# Patient Record
Sex: Female | Born: 1960 | Race: White | Hispanic: No | Smoking: Former smoker
Health system: Southern US, Community
[De-identification: ages and names within clinical notes are randomized; demographics above are authoritative.]

## PROBLEM LIST (undated history)

## (undated) DIAGNOSIS — N950 Postmenopausal bleeding: Secondary | ICD-10-CM

## (undated) DIAGNOSIS — K279 Peptic ulcer, site unspecified, unspecified as acute or chronic, without hemorrhage or perforation: Secondary | ICD-10-CM

## (undated) DIAGNOSIS — M3310 Other dermatopolymyositis, organ involvement unspecified: Secondary | ICD-10-CM

## (undated) DIAGNOSIS — F329 Major depressive disorder, single episode, unspecified: Secondary | ICD-10-CM

## (undated) DIAGNOSIS — N3010 Interstitial cystitis (chronic) without hematuria: Secondary | ICD-10-CM

## (undated) DIAGNOSIS — F32A Depression, unspecified: Secondary | ICD-10-CM

## (undated) DIAGNOSIS — R809 Proteinuria, unspecified: Secondary | ICD-10-CM

## (undated) DIAGNOSIS — K219 Gastro-esophageal reflux disease without esophagitis: Secondary | ICD-10-CM

## (undated) DIAGNOSIS — M1990 Unspecified osteoarthritis, unspecified site: Secondary | ICD-10-CM

## (undated) DIAGNOSIS — E785 Hyperlipidemia, unspecified: Secondary | ICD-10-CM

## (undated) HISTORY — PX: TUBAL LIGATION: SHX77

## (undated) HISTORY — PX: CHOLECYSTECTOMY: SHX55

## (undated) HISTORY — PX: ABDOMINAL HYSTERECTOMY: SHX81

---

## 1978-08-30 DIAGNOSIS — R809 Proteinuria, unspecified: Secondary | ICD-10-CM

## 1978-08-30 HISTORY — DX: Proteinuria, unspecified: R80.9

## 2004-08-30 DIAGNOSIS — N3010 Interstitial cystitis (chronic) without hematuria: Secondary | ICD-10-CM

## 2004-08-30 HISTORY — DX: Interstitial cystitis (chronic) without hematuria: N30.10

## 2005-02-04 ENCOUNTER — Ambulatory Visit: Payer: Self-pay | Admitting: Obstetrics and Gynecology

## 2005-03-24 ENCOUNTER — Ambulatory Visit: Payer: Self-pay | Admitting: Urology

## 2005-08-04 ENCOUNTER — Ambulatory Visit: Payer: Self-pay | Admitting: Rheumatology

## 2010-01-10 ENCOUNTER — Ambulatory Visit: Payer: Self-pay | Admitting: Internal Medicine

## 2010-06-02 ENCOUNTER — Ambulatory Visit: Payer: Self-pay | Admitting: Obstetrics and Gynecology

## 2010-07-14 ENCOUNTER — Other Ambulatory Visit: Payer: Self-pay

## 2010-08-11 ENCOUNTER — Ambulatory Visit: Payer: Self-pay | Admitting: Unknown Physician Specialty

## 2010-08-30 DIAGNOSIS — M1990 Unspecified osteoarthritis, unspecified site: Secondary | ICD-10-CM

## 2010-08-30 DIAGNOSIS — M3310 Other dermatopolymyositis, organ involvement unspecified: Secondary | ICD-10-CM

## 2010-08-30 DIAGNOSIS — K279 Peptic ulcer, site unspecified, unspecified as acute or chronic, without hemorrhage or perforation: Secondary | ICD-10-CM

## 2010-08-30 HISTORY — DX: Peptic ulcer, site unspecified, unspecified as acute or chronic, without hemorrhage or perforation: K27.9

## 2010-08-30 HISTORY — DX: Other dermatomyositis, organ involvement unspecified: M33.10

## 2010-08-30 HISTORY — DX: Unspecified osteoarthritis, unspecified site: M19.90

## 2010-10-28 ENCOUNTER — Ambulatory Visit: Payer: Self-pay | Admitting: Unknown Physician Specialty

## 2010-11-29 ENCOUNTER — Ambulatory Visit: Payer: Self-pay | Admitting: Rheumatology

## 2011-06-10 ENCOUNTER — Ambulatory Visit: Payer: Self-pay | Admitting: Obstetrics and Gynecology

## 2011-09-03 ENCOUNTER — Ambulatory Visit: Payer: Self-pay

## 2011-09-03 ENCOUNTER — Emergency Department: Payer: Self-pay | Admitting: Unknown Physician Specialty

## 2011-09-03 LAB — COMPREHENSIVE METABOLIC PANEL
Albumin: 4 g/dL (ref 3.4–5.0)
Alkaline Phosphatase: 137 U/L — ABNORMAL HIGH (ref 50–136)
BUN: 11 mg/dL (ref 7–18)
Calcium, Total: 8.8 mg/dL (ref 8.5–10.1)
Chloride: 106 mmol/L (ref 98–107)
Creatinine: 0.76 mg/dL (ref 0.60–1.30)
Glucose: 93 mg/dL (ref 65–99)
SGOT(AST): 38 U/L — ABNORMAL HIGH (ref 15–37)
Sodium: 142 mmol/L (ref 136–145)
Total Protein: 7.3 g/dL (ref 6.4–8.2)

## 2011-09-03 LAB — LIPASE, BLOOD: Lipase: 158 U/L (ref 73–393)

## 2011-09-03 LAB — CBC
HCT: 41.5 % (ref 35.0–47.0)
MCV: 94 fL (ref 80–100)
RBC: 4.43 10*6/uL (ref 3.80–5.20)

## 2011-09-03 LAB — MAGNESIUM: Magnesium: 1.6 mg/dL — ABNORMAL LOW

## 2011-09-03 LAB — URINALYSIS, COMPLETE
Bacteria: NONE SEEN
Bilirubin,UR: NEGATIVE
Blood: NEGATIVE
Glucose,UR: NEGATIVE mg/dL (ref 0–75)
RBC,UR: <1 /HPF (ref 0–5)
Specific Gravity: 1.01 (ref 1.003–1.030)
Squamous Epithelial: <1
WBC UR: 2 /HPF (ref 0–5)

## 2012-06-14 ENCOUNTER — Ambulatory Visit: Payer: Self-pay | Admitting: Obstetrics and Gynecology

## 2012-08-30 DIAGNOSIS — N950 Postmenopausal bleeding: Secondary | ICD-10-CM

## 2012-08-30 HISTORY — DX: Postmenopausal bleeding: N95.0

## 2012-12-01 ENCOUNTER — Ambulatory Visit: Payer: Self-pay | Admitting: Unknown Physician Specialty

## 2012-12-06 ENCOUNTER — Ambulatory Visit: Payer: Self-pay | Admitting: Surgery

## 2012-12-07 LAB — PATHOLOGY REPORT

## 2013-06-15 ENCOUNTER — Ambulatory Visit: Payer: Self-pay | Admitting: Obstetrics and Gynecology

## 2013-07-12 ENCOUNTER — Ambulatory Visit: Payer: Self-pay | Admitting: Unknown Physician Specialty

## 2014-06-18 ENCOUNTER — Ambulatory Visit: Payer: Self-pay | Admitting: Obstetrics and Gynecology

## 2014-12-20 NOTE — Op Note (Signed)
PATIENT NAME:  Amy Hammond, Amy Hammond MR#:  [id number] DATE OF BIRTH:  04/10/1961  DATE OF PROCEDURE:  12/06/2012  PREOPERATIVE DIAGNOSIS: Chronic cholecystitis and cholelithiasis.   POSTOPERATIVE DIAGNOSIS: Chronic cholecystitis and cholelithiasis.   PROCEDURE: Laparoscopic cholecystectomy and cholangiogram.   SURGEON: Loreli Dollar, MD.   ANESTHESIA: General.   INDICATIONS: This 54 year old female has had multiple bouts of epigastric pains. She had ultrasound findings of gallstones, and surgery was recommended for definitive treatment.   DESCRIPTION OF PROCEDURE: The patient was placed on the operating table in the supine position under general anesthesia. The abdomen was prepared with ChloraPrep and draped in a sterile manner. A short incision was made in the inferior aspect of the umbilicus and carried down to the deep fascia which was grasped with a laryngeal hook and elevated. A Veress needle was inserted, aspirated and irrigated with a saline solution. Next, the peritoneal cavity was inflated with carbon dioxide. The Veress needle was removed. The 10 mm cannula was inserted. The 10 mm, 0-degree laparoscope was inserted to view the peritoneal cavity. The liver appeared normal. The patient was placed in reverse Trendelenburg position. Another incision was made in the epigastrium slightly to the right of the midline to introduce an 11 mm cannula. Two incisions were made in the lateral aspect of the right lower quadrant to introduce two 5-mm cannulas.   The gallbladder was retracted towards the right shoulder. A number of adhesions were taken down with blunt and sharp dissection. The porta hepatis was demonstrated. The cystic duct was dissected free from surrounding structures. The cystic artery was dissected free from surrounding structures. The neck of the gallbladder was mobilized with incision of the visceral peritoneum. A critical view of safety was demonstrated. An Endo Clip was placed across  the cystic duct adjacent to the neck of the gallbladder. An incision was made in the cystic duct to introduce a Reddick catheter. Half-strength Conray-60 dye was injected as the cholangiogram was done with fluoroscopy viewing the biliary tree and flow of dye into the duodenum. No retained stones were seen. The Reddick catheter was removed. The cystic duct was doubly ligated with Endo Clips and divided. The cystic artery was controlled with double Endo Clips and divided. The gallbladder was dissected free from the liver with hook and cautery. Bleeding was very scant. The gallbladder was delivered up through the infraumbilical incision, opened and suctioned, removed and sent with multiple small palpable stones for routine pathology. The right upper quadrant was further inspected. Hemostasis was intact. The cannulas were removed, allowing carbon dioxide to escape from the peritoneal cavity. Skin incisions were closed with interrupted 5-0 chromic subcuticular sutures, benzoin and Steri-Strips. Dressings were applied with paper tape. The patient tolerated surgery satisfactorily and was prepared for transfer to the recovery room.    ____________________________ Lenna Sciara. Rochel Brome, MD jws:gb D: 12/13/2012 21:27:42 ET T: 12/13/2012 21:56:40 ET JOB#: [id number]  cc: Loreli Dollar, MD, <Dictator> Loreli Dollar MD ELECTRONICALLY SIGNED 12/14/2012 19:19

## 2015-05-29 ENCOUNTER — Other Ambulatory Visit: Payer: Self-pay | Admitting: Obstetrics and Gynecology

## 2015-05-29 DIAGNOSIS — Z1231 Encounter for screening mammogram for malignant neoplasm of breast: Secondary | ICD-10-CM

## 2015-06-24 ENCOUNTER — Ambulatory Visit
Admission: RE | Admit: 2015-06-24 | Discharge: 2015-06-24 | Disposition: A | Discharge status: Home / Self Care | Payer: BC Managed Care – PPO | Source: Ambulatory Visit | Attending: Obstetrics and Gynecology | Admitting: Obstetrics and Gynecology

## 2015-06-24 DIAGNOSIS — Z1231 Encounter for screening mammogram for malignant neoplasm of breast: Secondary | ICD-10-CM | POA: Insufficient documentation

## 2015-06-30 NOTE — H&P (Signed)
[name] is a 54 y.o G3P3. Female scheduled for a TVH and bilateral salpingectomy for PMB .  Marland Kitchen Pt's  SIS was normal 3 months ago . Negative EMBX and pap smear 1 yr ago . Currently on prempro 0.625 / 2.5 mg . Bleeds for 21 days per month . + fatigue .   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  Past Surgical History:  has a past surgical history that includes Tubal ligation; Cholecystectomy; Colonoscopy (09/05/2013); egd (09/05/2013); Endometrial biopsy (05/23/2013); Cervical biopsy and ECC (02/22/2005); and Colposcopy with cervical biopsy and ECC (02/22/2005). Family History: family history includes Cervical cancer in her mother; Emphysema in her father; Irritable bowel syndrome in her mother; Other in her father. Social History:  reports that she has quit smoking. She has quit using smokeless tobacco. She reports that she does not drink alcohol or use illicit drugs. OB/GYN History:  OB History    Gravida Para Term Preterm AB TAB SAB Ectopic Multiple Living   3 3 3       3       Allergies: is allergic to biaxin [clarithromycin]; macrobid [nitrofurantoin monohyd/m-cryst]; meloxicam; and penicillin v potassium. Medications:  Current Outpatient Prescriptions:  . calcium carbonate-vitamin D3 600mg  (1,000mg ) -1,000 unit Tab, Take by mouth 2 (two) times daily., Disp: , Rfl:  . melatonin 1 mg tablet, Take by mouth nightly., Disp: , Rfl:  . methotrexate (RHEUMATREX) 2.5 MG tablet, Take 4 tablets (10 mg total) by mouth every 7 (seven) days., Disp: 16 tablet, Rfl: 6 .  pantoprazole (PROTONIX) 40 MG DR tablet, TAKE 1 TABLET BY MOUTH TWICE DAILY, Disp: 180 tablet, Rfl: 1 . potassium 99 mg Tab, Take by mouth once daily., Disp: , Rfl:  . sucralfate (CARAFATE) 100 mg/mL suspension, Take 1 g by mouth 2 (two) times daily before meals., Disp: , Rfl:  . venlafaxine (EFFEXOR-XR) 75 MG XR capsule, TAKE ONE CAPSULE BY MOUTH ONCE DAILY, Disp: 90 capsule, Rfl: 1 . estrogens, conjugated, (PREMARIN) 0.625 MG tablet, Take 1 tablet (0.625 mg total) by mouth once daily., Disp: 30 tablet, Rfl: 11 . medroxyPROGESTERone (PROVERA) 10 MG tablet, Take 1 po q day for 10 days each month, Disp: 10 tablet, Rfl: 11  Review of Systems: General:   No fatigue or weight loss Eyes:   No vision changes Ears:   No hearing difficulty Respiratory:   No cough or shortness of breath Pulmonary:   No asthma or shortness of breath Cardiovascular:  No chest pain, palpitations, dyspnea on exertion Gastrointestinal:  No abdominal bloating, chronic diarrhea, constipations, masses, pain or hematochezia Genitourinary:  No hematuria, dysuria, abnormal vaginal discharge, pelvic pain, Menometrorrhagia, + PMB  Lymphatic:  No swollen lymph nodes Musculoskeletal: No muscle weakness Neurologic:  No extremity weakness, syncope, seizure disorder Psychiatric:  No history of depression, delusions or suicidal/homicidal ideation   Exam:      Vitals:   07/01/15  BP: 130/64  Pulse: 91    Body mass index is 25.58 kg/(m^2).  WDWN white/ female in NAD  Lungs: CTA  CV : RRR without murmur  Breast: exam done in sitting and lying position : No dimpling or retraction, no dominant mass, no spontaneous discharge, no axillary adenopathy Neck: no thyromegaly Abdomen: soft , no mass,  normal active bowel sounds, non-tender, no rebound tenderness Pelvic: tanner stage 5 ,  External genitalia: vulva /labia no lesions Urethra: no prolapse Vagina: normal physiologic d/c, adequate room for vag hyst   Cervix: no lesions, no cervical motion tenderness  Uterus: normal size shape and contour, non-tender Adnexa: no mass, non-tender  Rectovaginal: no mass   Impression:    Post-menopausal bleeding unresponsive to conservative tx   Plan:   Spoke to pt about DUB , and the options of continue HRT manipulation (cyclic), vs ablative procedure vs definitive TVH . Pt elects for Amy Hammond and bilateral salpingectomy .  The risks of the procedure were discussed with the pt . All questions answered .         Orders Placed This Encounter  Procedures

## 2015-07-01 ENCOUNTER — Other Ambulatory Visit: Payer: Self-pay

## 2015-07-01 ENCOUNTER — Encounter
Admission: RE | Admit: 2015-07-01 | Discharge: 2015-07-01 | Disposition: A | Discharge status: Home / Self Care | Payer: BC Managed Care – PPO | Source: Ambulatory Visit | Attending: Obstetrics and Gynecology | Admitting: Obstetrics and Gynecology

## 2015-07-01 DIAGNOSIS — N950 Postmenopausal bleeding: Secondary | ICD-10-CM | POA: Insufficient documentation

## 2015-07-01 DIAGNOSIS — Z01818 Encounter for other preprocedural examination: Secondary | ICD-10-CM | POA: Insufficient documentation

## 2015-07-01 HISTORY — DX: Major depressive disorder, single episode, unspecified: F32.9

## 2015-07-01 HISTORY — DX: Other dermatomyositis, organ involvement unspecified: M33.10

## 2015-07-01 HISTORY — DX: Depression, unspecified: F32.A

## 2015-07-01 HISTORY — DX: Gastro-esophageal reflux disease without esophagitis: K21.9

## 2015-07-01 HISTORY — DX: Postmenopausal bleeding: N95.0

## 2015-07-01 HISTORY — DX: Proteinuria, unspecified: R80.9

## 2015-07-01 HISTORY — DX: Unspecified osteoarthritis, unspecified site: M19.90

## 2015-07-01 HISTORY — DX: Hyperlipidemia, unspecified: E78.5

## 2015-07-01 HISTORY — DX: Interstitial cystitis (chronic) without hematuria: N30.10

## 2015-07-01 HISTORY — DX: Peptic ulcer, site unspecified, unspecified as acute or chronic, without hemorrhage or perforation: K27.9

## 2015-07-01 LAB — CBC
HEMATOCRIT: 42.4 % (ref 35.0–47.0)
HEMOGLOBIN: 14.3 g/dL (ref 12.0–16.0)
MCH: 32 pg (ref 26.0–34.0)
MCHC: 33.6 g/dL (ref 32.0–36.0)
MCV: 95.2 fL (ref 80.0–100.0)
Platelets: 232 10*3/uL (ref 150–440)
RBC: 4.46 MIL/uL (ref 3.80–5.20)
RDW: 12.1 % (ref 11.5–14.5)
WBC: 6.4 10*3/uL (ref 3.6–11.0)

## 2015-07-01 LAB — BASIC METABOLIC PANEL
ANION GAP: 11 (ref 5–15)
BUN: 11 mg/dL (ref 6–20)
CHLORIDE: 102 mmol/L (ref 101–111)
CO2: 27 mmol/L (ref 22–32)
Calcium: 9.6 mg/dL (ref 8.9–10.3)
Creatinine, Ser: 0.72 mg/dL (ref 0.44–1.00)
GFR calc Af Amer: >60 mL/min (ref >60)
Glucose, Bld: 74 mg/dL (ref 65–99)
POTASSIUM: 3.8 mmol/L (ref 3.5–5.1)
SODIUM: 140 mmol/L (ref 135–145)

## 2015-07-01 LAB — TYPE AND SCREEN
ABO/RH(D): B POS
ANTIBODY SCREEN: NEGATIVE

## 2015-07-01 LAB — ABO/RH: ABO/RH(D): B POS

## 2015-07-01 MED ORDER — FLEET ENEMA 7-19 GM/118ML RE ENEM
1.0000 | ENEMA | Freq: Once | RECTAL | Status: DC
  Filled 2015-07-01: qty 1

## 2015-07-01 NOTE — Patient Instructions (Addendum)
  Your procedure is scheduled on: Monday Nov. 7, 2016. Report to Same Day Surgery. To find out your arrival time please call [phone] between 1PM - 3PM on Friday Nov.4, 2016.  Remember: Instructions that are not followed completely may result in serious medical risk, up to and including death, or upon the discretion of your surgeon and anesthesiologist your surgery may need to be rescheduled.    __x__ 1. Do not eat food or drink liquids after midnight. No gum chewing or hard candies.     ____ 2. No Alcohol for 24 hours before or after surgery.   ____ 3. Bring all medications with you on the day of surgery if instructed.    _x___ 4. Notify your doctor if there is any change in your medical condition     (cold, fever, infections).     Do not wear jewelry, make-up, hairpins, clips or nail polish.  Do not wear lotions, powders, or perfumes. You may wear deodorant.  Do not shave 48 hours prior to surgery. Men may shave face and neck.  Do not bring valuables to the hospital.    [facility] is not responsible for any belongings or valuables.               Contacts, dentures or bridgework may not be worn into surgery.  Leave your suitcase in the car. After surgery it may be brought to your room.  For patients admitted to the hospital, discharge time is determined by your treatment team.   Patients discharged the day of surgery will not be allowed to drive home.    Please read over the following fact sheets that you were given:   [facility] Preparing for Surgery  __x__ Take these medicines the morning of surgery with A SIP OF WATER:    1. pantoprazole (PROTONIX)  2. venlafaxine (EFFEXOR)  3. Potassium 95 MG TABS if needed  _x___ Fleet Enema (as directed)   ____ Use CHG Soap as directed  ____ Use inhalers on the day of surgery  ____ Stop metformin 2 days prior to surgery    ____ Take 1/2 of usual insulin dose the night before surgery and none on the morning of surgery.    ____ Stop Coumadin/Plavix/aspirin on does not apply.  _x__ Stop Anti-inflammatories Ibuprofen now, may take Tylenol for pain.   _x_ Stop supplements Melatonin until after surgery.    ____ Bring C-Pap to the hospital.

## 2015-07-01 NOTE — Pre-Procedure Instructions (Signed)
Called Dr. Synthia Innocent office at 1040, spoke to Stamps to request H+P and Pre-op orders.

## 2015-07-07 ENCOUNTER — Encounter
Admission: RE | Disposition: A | Discharge status: Home / Self Care | Payer: Self-pay | Source: Ambulatory Visit | Attending: Obstetrics and Gynecology

## 2015-07-07 ENCOUNTER — Ambulatory Visit: Payer: BC Managed Care – PPO | Admitting: Anesthesiology

## 2015-07-07 ENCOUNTER — Observation Stay
Admission: RE | Admit: 2015-07-07 | Discharge: 2015-07-08 | Disposition: A | Discharge status: Home / Self Care | Payer: BC Managed Care – PPO | Source: Ambulatory Visit | Attending: Obstetrics and Gynecology | Admitting: Obstetrics and Gynecology

## 2015-07-07 DIAGNOSIS — Z8379 Family history of other diseases of the digestive system: Secondary | ICD-10-CM | POA: Insufficient documentation

## 2015-07-07 DIAGNOSIS — K219 Gastro-esophageal reflux disease without esophagitis: Secondary | ICD-10-CM | POA: Diagnosis not present

## 2015-07-07 DIAGNOSIS — Z9889 Other specified postprocedural states: Secondary | ICD-10-CM

## 2015-07-07 DIAGNOSIS — F329 Major depressive disorder, single episode, unspecified: Secondary | ICD-10-CM | POA: Diagnosis not present

## 2015-07-07 DIAGNOSIS — E785 Hyperlipidemia, unspecified: Secondary | ICD-10-CM | POA: Insufficient documentation

## 2015-07-07 DIAGNOSIS — F419 Anxiety disorder, unspecified: Secondary | ICD-10-CM | POA: Diagnosis not present

## 2015-07-07 DIAGNOSIS — Z87891 Personal history of nicotine dependence: Secondary | ICD-10-CM | POA: Insufficient documentation

## 2015-07-07 DIAGNOSIS — N888 Other specified noninflammatory disorders of cervix uteri: Principal | ICD-10-CM | POA: Insufficient documentation

## 2015-07-07 DIAGNOSIS — Z79899 Other long term (current) drug therapy: Secondary | ICD-10-CM | POA: Diagnosis not present

## 2015-07-07 DIAGNOSIS — Z9049 Acquired absence of other specified parts of digestive tract: Secondary | ICD-10-CM | POA: Insufficient documentation

## 2015-07-07 DIAGNOSIS — Z8049 Family history of malignant neoplasm of other genital organs: Secondary | ICD-10-CM | POA: Insufficient documentation

## 2015-07-07 DIAGNOSIS — Z825 Family history of asthma and other chronic lower respiratory diseases: Secondary | ICD-10-CM | POA: Insufficient documentation

## 2015-07-07 DIAGNOSIS — N950 Postmenopausal bleeding: Secondary | ICD-10-CM | POA: Diagnosis present

## 2015-07-07 HISTORY — PX: BILATERAL SALPINGECTOMY: SHX5743

## 2015-07-07 HISTORY — PX: VAGINAL HYSTERECTOMY: SHX2639

## 2015-07-07 LAB — CBC
HCT: 38.2 % (ref 35.0–47.0)
HEMOGLOBIN: 12.8 g/dL (ref 12.0–16.0)
MCH: 31.9 pg (ref 26.0–34.0)
MCHC: 33.6 g/dL (ref 32.0–36.0)
MCV: 94.8 fL (ref 80.0–100.0)
Platelets: 200 10*3/uL (ref 150–440)
RBC: 4.03 MIL/uL (ref 3.80–5.20)
RDW: 12 % (ref 11.5–14.5)
WBC: 10.5 10*3/uL (ref 3.6–11.0)

## 2015-07-07 LAB — POCT PREGNANCY, URINE: PREG TEST UR: NEGATIVE

## 2015-07-07 SURGERY — HYSTERECTOMY, VAGINAL
Anesthesia: General | Site: Uterus | Wound class: Clean Contaminated

## 2015-07-07 MED ORDER — LIDOCAINE-EPINEPHRINE 1 %-1:100000 IJ SOLN
INTRAMUSCULAR | Status: AC
  Filled 2015-07-07: qty 1

## 2015-07-07 MED ORDER — GENTAMICIN SULFATE 40 MG/ML IJ SOLN
5.0000 mg/kg | Freq: Once | INTRAVENOUS | Status: AC
  Administered 2015-07-07: 340 mg via INTRAVENOUS
  Filled 2015-07-07: qty 8.5

## 2015-07-07 MED ORDER — HYDROMORPHONE HCL 1 MG/ML IJ SOLN
INTRAMUSCULAR | Status: DC | PRN
  Administered 2015-07-07: .6 mg via INTRAVENOUS

## 2015-07-07 MED ORDER — GENTAMICIN SULFATE 40 MG/ML IJ SOLN
101.4000 mg | INTRAVENOUS | Status: DC | PRN
  Administered 2015-07-07: 338 mg via INTRAVENOUS

## 2015-07-07 MED ORDER — ROCURONIUM BROMIDE 100 MG/10ML IV SOLN
INTRAVENOUS | Status: DC | PRN
  Administered 2015-07-07: 30 mg via INTRAVENOUS

## 2015-07-07 MED ORDER — ONDANSETRON HCL 4 MG/2ML IJ SOLN: 4.0000 mg | Freq: Once | INTRAMUSCULAR | Status: DC | PRN

## 2015-07-07 MED ORDER — LIDOCAINE-EPINEPHRINE 1 %-1:100000 IJ SOLN
INTRAMUSCULAR | Status: DC | PRN
  Administered 2015-07-07: 8 mL

## 2015-07-07 MED ORDER — PROPOFOL 10 MG/ML IV BOLUS
INTRAVENOUS | Status: DC | PRN
  Administered 2015-07-07: 100 mg via INTRAVENOUS
  Administered 2015-07-07: 50 mg via INTRAVENOUS

## 2015-07-07 MED ORDER — LACTATED RINGERS IV SOLN
INTRAVENOUS | Status: DC
  Administered 2015-07-07: 06:00:00 via INTRAVENOUS

## 2015-07-07 MED ORDER — MIDAZOLAM HCL 2 MG/2ML IJ SOLN
INTRAMUSCULAR | Status: DC | PRN
  Administered 2015-07-07: 2 mg via INTRAVENOUS

## 2015-07-07 MED ORDER — GENTAMICIN SULFATE 40 MG/ML IJ SOLN: INTRAVENOUS | Status: DC

## 2015-07-07 MED ORDER — OXYCODONE-ACETAMINOPHEN 5-325 MG PO TABS
1.0000 | ORAL_TABLET | ORAL | Status: DC | PRN
  Administered 2015-07-07 (×2): 2 via ORAL
  Administered 2015-07-07: 1 via ORAL
  Administered 2015-07-08 (×2): 2 via ORAL
  Filled 2015-07-07 (×2): qty 2
  Filled 2015-07-07: qty 1
  Filled 2015-07-07 (×2): qty 2

## 2015-07-07 MED ORDER — SIMETHICONE 80 MG PO CHEW: 80.0000 mg | CHEWABLE_TABLET | Freq: Four times a day (QID) | ORAL | Status: DC | PRN

## 2015-07-07 MED ORDER — FENTANYL CITRATE (PF) 100 MCG/2ML IJ SOLN
INTRAMUSCULAR | Status: DC | PRN
  Administered 2015-07-07 (×3): 50 ug via INTRAVENOUS

## 2015-07-07 MED ORDER — LIDOCAINE HCL (CARDIAC) 20 MG/ML IV SOLN
INTRAVENOUS | Status: DC | PRN
  Administered 2015-07-07: 80 mg via INTRAVENOUS

## 2015-07-07 MED ORDER — CLINDAMYCIN PHOSPHATE 900 MG/50ML IV SOLN
900.0000 mg | Freq: Once | INTRAVENOUS | Status: AC
  Administered 2015-07-07: 900 mg via INTRAVENOUS

## 2015-07-07 MED ORDER — ONDANSETRON HCL 4 MG PO TABS: 4.0000 mg | ORAL_TABLET | Freq: Four times a day (QID) | ORAL | Status: DC | PRN

## 2015-07-07 MED ORDER — KETOROLAC TROMETHAMINE 30 MG/ML IJ SOLN
30.0000 mg | Freq: Three times a day (TID) | INTRAMUSCULAR | Status: DC
  Administered 2015-07-07 – 2015-07-08 (×3): 30 mg via INTRAVENOUS
  Filled 2015-07-07 (×4): qty 1

## 2015-07-07 MED ORDER — CLINDAMYCIN PHOSPHATE 900 MG/50ML IV SOLN
INTRAVENOUS | Status: AC
  Filled 2015-07-07: qty 50

## 2015-07-07 MED ORDER — KETOROLAC TROMETHAMINE 15 MG/ML IJ SOLN
INTRAMUSCULAR | Status: DC | PRN
  Administered 2015-07-07: 30 mg via INTRAVENOUS

## 2015-07-07 MED ORDER — FENTANYL CITRATE (PF) 100 MCG/2ML IJ SOLN
INTRAMUSCULAR | Status: AC
  Administered 2015-07-07: 25 ug via INTRAVENOUS
  Filled 2015-07-07: qty 2

## 2015-07-07 MED ORDER — DEXAMETHASONE SODIUM PHOSPHATE 10 MG/ML IJ SOLN
INTRAMUSCULAR | Status: DC | PRN
  Administered 2015-07-07: 5 mg via INTRAVENOUS

## 2015-07-07 MED ORDER — ONDANSETRON HCL 4 MG/2ML IJ SOLN
INTRAMUSCULAR | Status: DC | PRN
  Administered 2015-07-07: 4 mg via INTRAVENOUS

## 2015-07-07 MED ORDER — NEOSTIGMINE METHYLSULFATE 10 MG/10ML IV SOLN
INTRAVENOUS | Status: DC | PRN
  Administered 2015-07-07: 3 mg via INTRAVENOUS

## 2015-07-07 MED ORDER — DIPHENHYDRAMINE HCL 25 MG PO CAPS: 50.0000 mg | ORAL_CAPSULE | Freq: Four times a day (QID) | ORAL | Status: DC | PRN

## 2015-07-07 MED ORDER — GLYCOPYRROLATE 0.2 MG/ML IJ SOLN
INTRAMUSCULAR | Status: DC | PRN
  Administered 2015-07-07: 0.6 mg via INTRAVENOUS

## 2015-07-07 MED ORDER — ONDANSETRON HCL 4 MG/2ML IJ SOLN: 4.0000 mg | Freq: Four times a day (QID) | INTRAMUSCULAR | Status: DC | PRN

## 2015-07-07 MED ORDER — FENTANYL CITRATE (PF) 100 MCG/2ML IJ SOLN
25.0000 ug | INTRAMUSCULAR | Status: DC | PRN
  Administered 2015-07-07 (×4): 25 ug via INTRAVENOUS

## 2015-07-07 MED ORDER — MORPHINE SULFATE (PF) 2 MG/ML IV SOLN
1.0000 mg | INTRAVENOUS | Status: DC | PRN
  Administered 2015-07-07: 2 mg via INTRAVENOUS
  Administered 2015-07-07: 1 mg via INTRAVENOUS
  Filled 2015-07-07 (×2): qty 1

## 2015-07-07 MED ORDER — LACTATED RINGERS IV SOLN
INTRAVENOUS | Status: DC
  Administered 2015-07-07: 08:00:00 via INTRAVENOUS

## 2015-07-07 SURGICAL SUPPLY — 45 items
BAG URO DRAIN 2000ML W/SPOUT (MISCELLANEOUS) ×4 IMPLANT
CANISTER SUCT 1200ML W/VALVE (MISCELLANEOUS) ×4 IMPLANT
CATH FOLEY 2WAY  5CC 16FR (CATHETERS) ×2
CATH ROBINSON RED A/P 16FR (CATHETERS) ×4 IMPLANT
CATH TRAY 16F METER LATEX (MISCELLANEOUS) IMPLANT
CATH URTH 16FR FL 2W BLN LF (CATHETERS) ×2 IMPLANT
DISSECTOR KITTNER STICK (MISCELLANEOUS) IMPLANT
DISSECTORS/KITTNER STICK (MISCELLANEOUS)
DRAPE LAPAROTOMY TRNSV 106X77 (MISCELLANEOUS) ×4 IMPLANT
DRAPE PERI LITHO V/GYN (MISCELLANEOUS) ×4 IMPLANT
DRAPE SURG 17X11 SM STRL (DRAPES) ×4 IMPLANT
DRAPE UNDER BUTTOCK W/FLU (DRAPES) ×4 IMPLANT
DRSG TELFA 3X8 NADH (GAUZE/BANDAGES/DRESSINGS) IMPLANT
ELECT BLADE 6 FLAT ULTRCLN (ELECTRODE) ×4 IMPLANT
GLOVE BIO SURGEON STRL SZ8 (GLOVE) ×4 IMPLANT
GLOVE INDICATOR 8.0 STRL GRN (GLOVE) ×4 IMPLANT
GOWN STRL REUS W/ TWL LRG LVL3 (GOWN DISPOSABLE) ×6 IMPLANT
GOWN STRL REUS W/ TWL XL LVL3 (GOWN DISPOSABLE) ×2 IMPLANT
GOWN STRL REUS W/TWL LRG LVL3 (GOWN DISPOSABLE) ×6
GOWN STRL REUS W/TWL XL LVL3 (GOWN DISPOSABLE) ×2
IV NS 1000ML (IV SOLUTION)
IV NS 1000ML BAXH (IV SOLUTION) IMPLANT
KIT RM TURNOVER CYSTO AR (KITS) ×4 IMPLANT
LABEL OR SOLS (LABEL) IMPLANT
NDL SAFETY 22GX1.5 (NEEDLE) ×4 IMPLANT
PACK BASIN MAJOR ARMC (MISCELLANEOUS) ×4 IMPLANT
PACK BASIN MINOR ARMC (MISCELLANEOUS) ×4 IMPLANT
PAD GROUND ADULT SPLIT (MISCELLANEOUS) ×4 IMPLANT
PAD OB MATERNITY 4.3X12.25 (PERSONAL CARE ITEMS) ×4 IMPLANT
PAD PREP 24X41 OB/GYN DISP (PERSONAL CARE ITEMS) ×4 IMPLANT
SPONGE XRAY 4X4 16PLY STRL (MISCELLANEOUS) ×4 IMPLANT
STAPLER SKIN PROX 35W (STAPLE) ×4 IMPLANT
SUT PDS 2-0 27IN (SUTURE) ×4 IMPLANT
SUT VIC AB 0 CT1 27 (SUTURE) ×6
SUT VIC AB 0 CT1 27XCR 8 STRN (SUTURE) ×6 IMPLANT
SUT VIC AB 0 CT1 36 (SUTURE) ×8 IMPLANT
SUT VIC AB 2-0 CT1 (SUTURE) ×4 IMPLANT
SUT VIC AB 2-0 SH 27 (SUTURE) ×6
SUT VIC AB 2-0 SH 27XBRD (SUTURE) ×6 IMPLANT
SUT VICRYL PLUS ABS 0 54 (SUTURE) ×4 IMPLANT
SYR BULB IRRIG 60ML STRL (SYRINGE) ×4 IMPLANT
SYR CONTROL 10ML (SYRINGE) ×4 IMPLANT
SYRINGE 10CC LL (SYRINGE) ×4 IMPLANT
TRAY PREP VAG/GEN (MISCELLANEOUS) ×4 IMPLANT
WATER STERILE IRR 1000ML POUR (IV SOLUTION) ×4 IMPLANT

## 2015-07-07 NOTE — Anesthesia Procedure Notes (Signed)
Procedure Name: Intubation Date/Time: 07/07/2015 7:50 AM Performed by: Justus Memory Pre-anesthesia Checklist: Patient identified, Emergency Drugs available, Suction available and Patient being monitored Patient Re-evaluated:Patient Re-evaluated prior to inductionOxygen Delivery Method: Circle system utilized Preoxygenation: Pre-oxygenation with 100% oxygen Intubation Type: IV induction Ventilation: Mask ventilation without difficulty Laryngoscope Size: Mac and 3 Grade View: Grade I Tube type: Oral Tube size: 7.0 mm Number of attempts: 1 Airway Equipment and Method: Patient positioned with wedge pillow and Stylet Placement Confirmation: ETT inserted through vocal cords under direct vision,  positive ETCO2,  CO2 detector and breath sounds checked- equal and bilateral Secured at: 20 cm Tube secured with: Tape Dental Injury: Teeth and Oropharynx as per pre-operative assessment

## 2015-07-07 NOTE — Progress Notes (Signed)
Pt interviewed today . No changes . Labs reviewed . Ready for surgery . All questions answered

## 2015-07-07 NOTE — Transfer of Care (Signed)
Immediate Anesthesia Transfer of Care Note  Patient: Amy Hammond  Procedure(s) Performed: Procedure(s): HYSTERECTOMY VAGINAL (N/A) BILATERAL SALPINGECTOMY (Bilateral)  Patient Location: PACU  Anesthesia Type:General  Level of Consciousness: awake, alert  and oriented  Airway & Oxygen Therapy: Patient Spontanous Breathing and Patient connected to face mask oxygen  Post-op Assessment: Report given to RN and Post -op Vital signs reviewed and stable  Post vital signs: Reviewed and stable  Last Vitals:  Filed Vitals:   07/07/15 0610  BP: 139/59  Pulse: 75  Temp: 35.6 C  Resp: 16    Complications: No apparent anesthesia complications

## 2015-07-07 NOTE — Brief Op Note (Signed)
07/07/2015  8:51 AM  PATIENT:  Amy Hammond  54 y.o. female  PRE-OPERATIVE DIAGNOSIS:  POSTMENOPAUSAL BLEEDING  POST-OPERATIVE DIAGNOSIS:  POSTMENOPAUSAL BLEEDING  PROCEDURE:  Procedure(s): HYSTERECTOMY VAGINAL (N/A) BILATERAL SALPINGECTOMY (Bilateral)  SURGEON:  Surgeon(s) and Role:    Boykin Nearing, MD - Primary  PHYSICIAN ASSISTANT: Leafy Ro , B MD  ASSISTANTS: none   ANESTHESIA:   general  EBL:  Total I/O In: 800 [I.V.:800] Out: 225 [Urine:200; Blood:25]  BLOOD ADMINISTERED:none  DRAINS: Urinary Catheter (Foley)   LOCAL MEDICATIONS USED:  LIDOCAINE   SPECIMEN:  Source of Specimen:  cx , utx , bilateral fallopian tubes   DISPOSITION OF SPECIMEN:  PATHOLOGY  COUNTS:  YES  TOURNIQUET:  * No tourniquets in log *  DICTATION: .Other Dictation: Dictation Number verbal   PLAN OF CARE: Admit for overnight observation  PATIENT DISPOSITION:  PACU - hemodynamically stable.   Delay start of Pharmacological VTE agent (>24hrs) due to surgical blood loss or risk of bleeding: not applicable

## 2015-07-07 NOTE — Anesthesia Preprocedure Evaluation (Signed)
Anesthesia Evaluation  Patient identified by MRN, date of birth, ID band Patient awake    Reviewed: Allergy & Precautions, NPO status , Patient's Chart, lab work & pertinent test results, reviewed documented beta blocker date and time   Airway Mallampati: II  TM Distance: >3 FB     Dental  (+) Chipped   Pulmonary former smoker,           Cardiovascular      Neuro/Psych PSYCHIATRIC DISORDERS Depression  Neuromuscular disease    GI/Hepatic PUD, GERD  Medicated,  Endo/Other    Renal/GU      Musculoskeletal  (+) Arthritis , Rheumatoid disorders,    Abdominal   Peds  Hematology   Anesthesia Other Findings   Reproductive/Obstetrics                             Anesthesia Physical Anesthesia Plan  ASA: II  Anesthesia Plan: General   Post-op Pain Management:    Induction: Intravenous  Airway Management Planned: Oral ETT  Additional Equipment:   Intra-op Plan:   Post-operative Plan:   Informed Consent: I have reviewed the patients History and Physical, chart, labs and discussed the procedure including the risks, benefits and alternatives for the proposed anesthesia with the patient or authorized representative who has indicated his/her understanding and acceptance.     Plan Discussed with: CRNA  Anesthesia Plan Comments:         Anesthesia Quick Evaluation

## 2015-07-07 NOTE — Progress Notes (Signed)
Patient ID: Amy Hammond, female   DOB: 1961/06/21, 54 y.o.   MRN: [id number], c/o baldder spasm from foley catheter .  Good UO ,  VSS  Abd: soft NT + BS No blood on pad .  A: stable  P: saline lock IV , d/c foley  Adv diet and cont PO percocet and IV toradol

## 2015-07-08 DIAGNOSIS — N888 Other specified noninflammatory disorders of cervix uteri: Secondary | ICD-10-CM | POA: Diagnosis not present

## 2015-07-08 LAB — BASIC METABOLIC PANEL
ANION GAP: 7 (ref 5–15)
BUN: 10 mg/dL (ref 6–20)
CALCIUM: 8.7 mg/dL — AB (ref 8.9–10.3)
CHLORIDE: 100 mmol/L — AB (ref 101–111)
CO2: 29 mmol/L (ref 22–32)
Creatinine, Ser: 0.79 mg/dL (ref 0.44–1.00)
GFR calc Af Amer: >60 mL/min (ref >60)
GFR calc non Af Amer: >60 mL/min (ref >60)
GLUCOSE: 86 mg/dL (ref 65–99)
POTASSIUM: 3.6 mmol/L (ref 3.5–5.1)
Sodium: 136 mmol/L (ref 135–145)

## 2015-07-08 LAB — SURGICAL PATHOLOGY

## 2015-07-08 MED ORDER — OXYCODONE-ACETAMINOPHEN 5-325 MG PO TABS: 1.0000 | ORAL_TABLET | ORAL | Status: DC | PRN

## 2015-07-08 MED ORDER — DOCUSATE SODIUM 100 MG PO CAPS: 100.0000 mg | ORAL_CAPSULE | Freq: Two times a day (BID) | ORAL | Status: AC

## 2015-07-08 MED ORDER — IBUPROFEN 800 MG PO TABS
800.0000 mg | ORAL_TABLET | Freq: Three times a day (TID) | ORAL | Status: DC | PRN
Start: 2015-07-08 — End: 2023-02-08

## 2015-07-08 NOTE — Discharge Instructions (Signed)
Laparoscopically Assisted Vaginal Hysterectomy, Care After Refer to this sheet in the next few weeks. These instructions provide you with information on caring for yourself after your procedure. Your health care provider may also give you more specific instructions. Your treatment has been planned according to current medical practices, but problems sometimes occur. Call your health care provider if you have any problems or questions after your procedure. WHAT TO EXPECT AFTER THE PROCEDURE After your procedure, it is typical to have the following:  Abdominal pain. You will be given pain medicine to control it.  Sore throat from the breathing tube that was inserted during surgery. HOME CARE INSTRUCTIONS  Only take over-the-counter or prescription medicines for pain, discomfort, or fever as directed by your health care provider.  Do not take aspirin. It can cause bleeding.  Do not drive when taking pain medicine.  No heavy lifting or strenuous activity for the next 2 weeks.   Resume your usual diet as directed and allowed.  Get plenty of rest and sleep.  Do not douche, use tampons, or have sexual intercourse for at least 6 weeks, or until your health care provider gives you permission.  Monitor your temperature and notify your health care provider of a fever.  Take showers instead of baths for 2-3 weeks.  Do not drink alcohol until your health care provider gives you permission.  If you develop constipation, you may take a mild laxative with your health care provider's permission. Bran foods may help with constipation problems. Drinking enough fluids to keep your urine clear or pale yellow may help as well.  Try to have someone home with you for 1-2 weeks to help around the house.  Keep all of your follow-up appointments as directed by your health care provider. SEEK MEDICAL CARE IF:   You have swelling, redness, or increasing pain around your incision sites.  You have pus coming  from your incision.  You notice a bad smell coming from your incision/vagina.  Your incision breaks open.  You feel dizzy or lightheaded.  You have pain or bleeding when you urinate.  You have persistent diarrhea.  You have persistent nausea and vomiting.  You have abnormal vaginal discharge.  You have a rash.  You have any type of abnormal reaction or develop an allergy to your medicine.  You have poor pain control with your prescribed medicine. SEEK IMMEDIATE MEDICAL CARE IF:   You have a fever.  You have severe abdominal pain.  You have chest pain.  You have shortness of breath.  You faint.  You have pain, swelling, or redness in your leg.  You have heavy vaginal bleeding with blood clots. MAKE SURE YOU:  Understand these instructions.  Will watch your condition.  Will get help right away if you are not doing well or get worse.   This information is not intended to replace advice given to you by your health care provider. Make sure you discuss any questions you have with your health care provider.   Document Released: 08/05/2011 Document Revised: 08/21/2013 Document Reviewed: 03/01/2013 Elsevier Interactive Patient Education [employer].

## 2015-07-08 NOTE — Op Note (Signed)
Amy Hammond, SILVERIO NO.:  [id number]  MEDICAL RECORD NO.:  [id number]  LOCATION:  756E                         FACILITY:  [facility]  PHYSICIAN:  Laverta Baltimore, MDDATE OF BIRTH:  November 28, 1960  DATE OF PROCEDURE: DATE OF DISCHARGE:                              OPERATIVE REPORT   PREOPERATIVE DIAGNOSIS:  Postmenopausal bleeding, unresponsive to conservative therapy.  POSTOPERATIVE DIAGNOSIS:  Postmenopausal bleeding, unresponsive to conservative therapy.  PROCEDURE: 1. Total vaginal hysterectomy. 2. Bilateral salpingectomy.  ANESTHESIA:  General endotracheal anesthesia.  SURGEON:  Laverta Baltimore, M.D.  FIRST ASSISTANT:  Leafy Ro.  INDICATIONS:  A 54 year old, gravida 3, para 3 patient with a long history of postmenopausal bleeding with a negative workup.  The patient elects for a definitive treatment.  PROCEDURE:  After adequate general endotracheal anesthesia, the patient was placed in dorsal supine position with the legs in the candy-cane stirrups.  The patient's lower abdomen, perineum, and vagina were prepped and draped in normal sterile fashion.  The patient did receive preoperative antibiotics, including gentamicin of 338 mg and clindamycin of 900 mg.  Time-out was performed.  The patient's bladder was then catheterized with a red Robinson catheter, yielding 150 mL clear urine. A weighted speculum was placed in the posterior vaginal vault.  The anterior cervix was then doubly grasped with 2 thyroid tenacula.  The cervix was then circumferentially injected with 1% lidocaine with epinephrine.  A direct posterior colpotomy incision was made, upon entry into the posterior cul-de-sac.  The uterosacral ligaments were then bilaterally clamped, transected, suture ligated with #0 Vicryl suture. The anterior cervix was circumferentially incised with the Bovie.  The anterior cul-de-sac was entered sharply without difficulty.  A Deaver retractor was  placed within to elevate the bladder anteriorly.  The cardinal ligaments were then bilaterally clamped, transected, suture ligated with 0 Vicryl suture.  The uterine arteries were bilaterally clamped, transected, and suture ligated with 0 Vicryl suture.  Serial bites in the broad ligament ensued and transected and suture ligated. The cornu were then bilaterally clamped, transected, suture ligated with #0 Vicryl suture.  Each fallopian tube was identified and grasped with a Babcock clamp and each distal portion of the fallopian tube was clamped with a Haney clamp, and the distal portion of fallopian tubes were removed and each pedicle was ligated with 0 Vicryl suture.  Good hemostasis was noted.  The peritoneum was then closed with a 2-0 PDS suture in a pursestring fashion and the vaginal cuff was then closed with a running #0 Vicryl suture.  Good hemostasis was noted.  The uterosacral ligaments were plicated centrally and the rest of vaginal cuff was closed with 0 Vicryl suture.  There were no complications.  ESTIMATED BLOOD LOSS:  25 mL.  INTRAOPERATIVE FLUIDS:  800 mL.  The patient did receive 30 mg of intravenous Toradol at the end of the case.  Urine output total 200 mL.  The patient was taken to the recovery room in good condition.          ______________________________ Laverta Baltimore, MD     TS/MEDQ  D:  07/07/2015  T:  07/08/2015  Job:  [id number]

## 2015-07-08 NOTE — Anesthesia Postprocedure Evaluation (Signed)
  Anesthesia Post-op Note  Patient: [place]  Procedure(s) Performed: Procedure(s): HYSTERECTOMY VAGINAL (N/A) BILATERAL SALPINGECTOMY (Bilateral)  Anesthesia type:General  Patient location: PACU  Post pain: Pain level controlled  Post assessment: Post-op Vital signs reviewed, Patient's Cardiovascular Status Stable, Respiratory Function Stable, Patent Airway and No signs of Nausea or vomiting  Post vital signs: Reviewed and stable  Last Vitals:  Filed Vitals:   07/08/15 0753  BP: 132/63  Pulse: 60  Temp: 36.7 C  Resp: 18    Level of consciousness: awake, alert  and patient cooperative  Complications: No apparent anesthesia complications

## 2015-07-08 NOTE — Progress Notes (Signed)
Patient discharged home. Vital signs stable, bleeding within normal limits. Discharge instructions, prescriptions, and follow up appointment given to and reviewed with patient. Patient verbalized understanding, all questions answered. Escorted in wheelchair by auxiliary.

## 2015-07-08 NOTE — Discharge Summary (Signed)
Physician Discharge Summary  Patient ID: Amy Hammond MRN: [id number] DOB/AGE: 56-Sep-1962 56 y.o.  Admit date: 07/07/2015 Discharge date: 07/08/2015  Admission Diagnoses:PMB   Discharge Diagnoses: same  Active Problems:   Post-operative state   Discharged Condition: good  Hospital Course: pt underwent an uncomplicated TVH and Bilateral salpingectomy . POSt op without problems . Post op HCT 38%  Consults: None  Significant Diagnostic Studies: n/a  Treatments: none  Discharge Exam: Blood pressure 132/63, pulse 60, temperature 98.1 F (36.7 C), temperature source Oral, resp. rate 18, height 5\' 5"  (1.651 m), weight 149 lb (67.586 kg), SpO2 100 %. Lungs CTA  CV RRR  ABD; soft NT  Pelvic , scant blood   Disposition: Final discharge disposition not confirmed  Discharge Instructions    Call MD for:  difficulty breathing, headache or visual disturbances    Complete by:  As directed      Call MD for:  extreme fatigue    Complete by:  As directed      Call MD for:  hives    Complete by:  As directed      Call MD for:  persistant dizziness or light-headedness    Complete by:  As directed      Call MD for:  persistant nausea and vomiting    Complete by:  As directed      Call MD for:  redness, tenderness, or signs of infection (pain, swelling, redness, odor or green/yellow discharge around incision site)    Complete by:  As directed      Call MD for:  severe uncontrolled pain    Complete by:  As directed      Call MD for:  temperature >100.4    Complete by:  As directed      Diet - low sodium heart healthy    Complete by:  As directed      Increase activity slowly    Complete by:  As directed             Medication List    TAKE these medications        CALCIUM CARBONATE-VIT D-MIN PO  Take 1 tablet by mouth 2 (two) times daily.     docusate sodium 100 MG capsule  Commonly known as:  COLACE  Take 1 capsule (100 mg total) by mouth 2 (two) times daily.     ibuprofen 800 MG tablet  Commonly known as:  ADVIL,MOTRIN  Take 1 tablet (800 mg total) by mouth every 8 (eight) hours as needed.     Melatonin 1 MG Tabs  Take 1 tablet by mouth at bedtime.     methotrexate 10 MG tablet  Commonly known as:  RHEUMATREX  Take 10 mg by mouth once a week. Caution: Chemotherapy. Protect from light.     oxyCODONE-acetaminophen 5-325 MG tablet  Commonly known as:  PERCOCET/ROXICET  Take 1-2 tablets by mouth every 4 (four) hours as needed (moderate to severe pain (when tolerating fluids)).     pantoprazole 40 MG tablet  Commonly known as:  PROTONIX  Take 40 mg by mouth 2 (two) times daily.     Potassium 95 MG Tabs  Take by mouth 2 (two) times daily.     sucralfate 1 GM/10ML suspension  Commonly known as:  CARAFATE  Take 1 g by mouth 2 (two) times daily. As needed.     venlafaxine 75 MG tablet  Commonly known as:  EFFEXOR  Take 75 mg by mouth every  morning.     VITAMIN B 12 PO  Take 1 tablet by mouth 2 (two) times daily.           Follow-up Information    Follow up with Garyn Arlotta, MD In 2 weeks.   Specialty:  Obstetrics and Gynecology   Why:  For wound re-check   Contact information:   [address] [place] [address] [phone]       Signed: Laverta Baltimore 07/08/2015, 9:02 AM

## 2016-06-03 ENCOUNTER — Other Ambulatory Visit: Payer: Self-pay | Admitting: Obstetrics and Gynecology

## 2016-06-03 DIAGNOSIS — Z1231 Encounter for screening mammogram for malignant neoplasm of breast: Secondary | ICD-10-CM

## 2016-06-24 ENCOUNTER — Other Ambulatory Visit: Payer: Self-pay | Admitting: Obstetrics and Gynecology

## 2016-06-24 ENCOUNTER — Ambulatory Visit
Admission: RE | Admit: 2016-06-24 | Discharge: 2016-06-24 | Disposition: A | Discharge status: Home / Self Care | Payer: BC Managed Care – PPO | Source: Ambulatory Visit | Attending: Obstetrics and Gynecology | Admitting: Obstetrics and Gynecology

## 2016-06-24 DIAGNOSIS — Z1231 Encounter for screening mammogram for malignant neoplasm of breast: Secondary | ICD-10-CM

## 2016-11-30 NOTE — Progress Notes (Signed)
12/03/2016 2:54 PM   Amy Hammond Aug 01, 1961 [id number]  Referring provider: [name], MD [place], [place] [address]  Chief Complaint  Patient presents with  . New Patient (Initial Visit)    dysuria     HPI: Patient is a 56 -year-old Caucasian female who is referred to [name] by, [name], for recurrent urinary tract infections.  Patient states that she has had 6 to 7 urinary tract infections over the last year.  Her symptoms with a urinary tract infection consist of the feeling of a full bladder, urgency, frequency and bilateral flank pain.   She denies dysuria, gross hematuria, back pain and abdominal pain.  She endorses suprapubic pain, back pain and flank pain. She has had recent chills and malaise, but she does not have fevers, nausea or vomiting.   Her baseline urinary symptoms are frequency, urgency, nocturia and incontinence with urgency.    She does not have a history of nephrolithiasis, GU surgery or GU trauma.    Reviewing her records,  she has had three documented UTI's with varying organisms over the last year.    She is not sexually active.  She is post menopausal.   She admits to constipation.    She does engage in good perineal hygiene. She does not take tub baths.   She is having pain with bladder filling.     Contrast CT  study performed on 07/12/2013 noted no evidence of bowel obstruction.  Normal appendix.  Moderate stool in the left colon, raising the possibility of  constipation.  Otherwise, no CT findings to account for the patient's abdominal pain.   She is drinking one bottle of water daily.  She drinks mostly diet [facility] and sweat tea.  Her PVR was 56 mL.  UA was unremarkable.   PMH: Past Medical History:  Diagnosis Date  . 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 Arthritis 2012   osteo  . Depression   . GERD (gastroesophageal reflux disease)   . Hyperlipemia   . Interstitial cystitis 2006  . Peptic ulcer  disease 2012  . PMB (postmenopausal bleeding) 2014  . Proteinuria 1980    Surgical History: Past Surgical History:  Procedure Laterality Date  . ABDOMINAL HYSTERECTOMY    . BILATERAL SALPINGECTOMY Bilateral 07/07/2015   Procedure: BILATERAL SALPINGECTOMY;  Surgeon: Boykin Nearing, MD;  Location: [facility] ORS;  Service: Gynecology;  Laterality: Bilateral;  . CHOLECYSTECTOMY    . TUBAL LIGATION    . VAGINAL HYSTERECTOMY N/A 07/07/2015   Procedure: HYSTERECTOMY VAGINAL;  Surgeon: Boykin Nearing, MD;  Location: [facility] ORS;  Service: Gynecology;  Laterality: N/A;    Home Medications:  Allergies as of 12/03/2016      Reactions   Penicillins Anaphylaxis, Rash   Macrobid [nitrofurantoin Monohyd Macro] Itching   Meloxicam Itching   Premarin [conjugated Estrogens] Itching   Adhesive [tape] Rash   Paper tape ok to use.   Biaxin [clarithromycin] Rash      Medication List       Accurate as of 12/03/16  2:54 PM. Always use your most recent med list.          CALCIUM CARBONATE-VIT D-MIN PO Take 1 tablet by mouth 2 (two) times daily.   docusate sodium 100 MG capsule Commonly known as:  COLACE Take 1 capsule (100 mg total) by mouth 2 (two) times daily.   folic acid 1 MG tablet Commonly known as:  FOLVITE Take 1 mg by mouth  daily.   ibuprofen 800 MG tablet Commonly known as:  ADVIL,MOTRIN Take 1 tablet (800 mg total) by mouth every 8 (eight) hours as needed.   Melatonin 1 MG Tabs Take 1 tablet by mouth at bedtime.   methotrexate 10 MG tablet Commonly known as:  RHEUMATREX Take 10 mg by mouth once a week. Caution: Chemotherapy. Protect from light.   oxyCODONE-acetaminophen 5-325 MG tablet Commonly known as:  PERCOCET/ROXICET Take 1-2 tablets by mouth every 4 (four) hours as needed (moderate to severe pain (when tolerating fluids)).   pantoprazole 40 MG tablet Commonly known as:  PROTONIX Take 40 mg by mouth 2 (two) times daily.   Potassium 95 MG Tabs Take by mouth 2  (two) times daily.   sucralfate 1 GM/10ML suspension Commonly known as:  CARAFATE Take 1 g by mouth 2 (two) times daily. As needed.   venlafaxine 75 MG tablet Commonly known as:  EFFEXOR Take 75 mg by mouth every morning.   VITAMIN B 12 PO Take 1 tablet by mouth 2 (two) times daily.       Allergies:  Allergies  Allergen Reactions  . Penicillins Anaphylaxis and Rash  . Macrobid [Nitrofurantoin Monohyd Macro] Itching  . Meloxicam Itching  . Premarin [Conjugated Estrogens] Itching  . Adhesive [Tape] Rash    Paper tape ok to use.  . Biaxin [Clarithromycin] Rash    Family History: Family History  Problem Relation Age of Onset  . Breast cancer Paternal Grandmother     great gm  . Breast cancer Cousin 55    mat cousin  . Kidney disease Neg Hx   . Kidney cancer Neg Hx   . Prostate cancer Neg Hx     Social History:  reports that she quit smoking about 7 years ago. She has never used smokeless tobacco. She reports that she does not drink alcohol or use drugs.  ROS: UROLOGY Frequent Urination?: Yes Hard to postpone urination?: Yes Burning/pain with urination?: No Get up at night to urinate?: Yes Leakage of urine?: Yes Urine stream starts and stops?: No Trouble starting stream?: No Do you have to strain to urinate?: No Blood in urine?: No Urinary tract infection?: No Sexually transmitted disease?: No Injury to kidneys or bladder?: No Painful intercourse?: No Weak stream?: No Currently pregnant?: No Vaginal bleeding?: No Last menstrual period?: n  Gastrointestinal Nausea?: No Vomiting?: No Indigestion/heartburn?: Yes Diarrhea?: No Constipation?: Yes  Constitutional Fever: No Night sweats?: No Weight loss?: No Fatigue?: Yes  Skin Skin rash/lesions?: No Itching?: No  Eyes Blurred vision?: No Double vision?: No  Ears/Nose/Throat Sore throat?: No Sinus problems?: No  Hematologic/Lymphatic Swollen glands?: No Easy bruising?:  No  Cardiovascular Leg swelling?: No Chest pain?: No  Respiratory Cough?: No Shortness of breath?: No  Endocrine Excessive thirst?: Yes  Musculoskeletal Back pain?: Yes Joint pain?: Yes  Neurological Headaches?: No Dizziness?: No  Psychologic Depression?: No Anxiety?: Yes  Physical Exam: BP 138/60   Pulse 90   Ht _0  (1.626 m)   Wt 156 lb (70.8 kg)   BMI 26.78 kg/m   Constitutional: Well nourished. Alert and oriented, No acute distress. HEENT: [place] AT, moist mucus membranes. Trachea midline, no masses. Cardiovascular: No clubbing, cyanosis, or edema. Respiratory: Normal respiratory effort, no increased work of breathing. GI: Abdomen is soft, non tender, non distended, no abdominal masses. Liver and spleen not palpable.  No hernias appreciated.  Stool sample for occult testing is not indicated.   GU: No CVA tenderness.  No bladder  fullness or masses.  Atrophic external genitalia, normal pubic hair distribution, no lesions. Normal urethral meatus, no lesions, no prolapse, no discharge.   No urethral masses, tenderness and/or tenderness. No bladder fullness, tenderness or masses. Normal vagina mucosa, good estrogen effect, no discharge, no lesions, good pelvic support, Grade III cystocele is noted.  Rectocele is not noted.  Cervix and uterus are surgically absent.  No adnexal/parametria masses or tenderness noted.  Tenderness throughout the pelvic floor on palpation.  Anus and perineum are without rashes or lesions.    Skin: No rashes, bruises or suspicious lesions. Lymph: No cervical or inguinal adenopathy. Neurologic: Grossly intact, no focal deficits, moving all 4 extremities. Psychiatric: Normal mood and affect.  Laboratory Data: Lab Results  Component Value Date   WBC 10.5 07/07/2015   HGB 12.8 07/07/2015   HCT 38.2 07/07/2015   MCV 94.8 07/07/2015   PLT 200 07/07/2015    Lab Results  Component Value Date   CREATININE 0.79 07/08/2015     Lab Results   Component Value Date   AST 38 (H) 09/03/2011   Lab Results  Component Value Date   ALT 49 09/03/2011     Urinalysis Unremarkable.  See EPIC.    Pertinent Imaging: Results for CRISTY, COLMENARES (MRN [id number]) as of 12/06/2016 09:17  Ref. Range 12/03/2016 14:27  Scan Result Unknown 56    Assessment & Plan:    1. Recurrent UTI's  - criteria for recurrent UTI has been met with 2 or more infections in 6 months or 3 or greater infections in one year   - Patient is instructed to increase their water intake until the urine is pale yellow or clear (10 to 12 cups daily)   - probiotics (yogurt, oral pills or vaginal suppositories), take cranberry pills or drink the juice and Vitamin C 1,000 mg daily to acidify the urine should be added to their daily regimen   - avoid soaking in tubs and wipe front to back after urinating   - benefit from core strengthening exercises has been seen- patient desires referral to physical therapy  - advised them to have CATH UA's for urinalysis and culture to prevent skin contamination of the specimen  - reviewed symptoms of UTI and advised not to have urine checked or be treated for UTI if not experiencing symptoms  - discussed antibiotic stewardship with the patient    2. Vaginal atrophy  - I explained to the patient that when women go through menopause and her estrogen levels are severely diminished, the normal vaginal flora will change.  This is due to an increase of the vaginal canal's pH. Because of this, the vaginal canal may be colonized by bacteria from the rectum instead of the protective lactobacillus.  This accompanied by the loss of the mucus barrier with vaginal atrophy is a cause of recurrent urinary tract infections.  - In some studies, the use of vaginal estrogen cream has been demonstrated to reduce  recurrent urinary tract infections to one a year.   - Patient was given a sample of vaginal estrogen cream (Premarin) and instructed to apply 0.17m  (pea-sized amount)  just inside the vaginal introitus with a finger-tip every night for two weeks and then Monday, Wednesday and Friday nights.  I explained to the patient that vaginally administered estrogen, which causes only a slight increase in the blood estrogen levels, have fewer contraindications and adverse systemic effects that oral HT.  - I have also given prescriptions for the  Estrace cream and Premarin cream, so that the patient may carry them to the pharmacy to see which one of the branded creams would be most economical for her.  If she finds both medications cost prohibitive, she is instructed to call the office.  We can then call in a compounded vaginal estrogen cream for the patient that may be more affordable.    - She will follow up in three months for an exam.    3. Pelvic floor dysfunction  - explained to the patient that the "pelvic floor muscles" are a group of muscles that are arranged within the pelvis like a sling or hammock, connecting the front, back, and sides of the pelvis and sacrum. The main function of these muscles is to provide support to the organs of the pelvis, including the bladder, uterus or prostate, and rectum. They also make up part of the urethra, rectum, and vagina. These muscles must be able to effectively coordinate contraction and relaxation to allow normal functioning of the bowel and bladder. Moreover, the ability of these muscles to relax is essential to allow for normal urination, bowel movements, and sexual intercourse.  - explained that "Pelvic Floor Dysfunction," or PFD, refers to these muscles when they are too relaxed or when they have too much tension. Abnormal muscle tone can affect urinary and bowel functions, sexual function, and can cause pain.  - recommended PT for further evaluation and treatment                               Return in about 1 month (around 01/02/2017) for exam.  These notes generated with voice recognition software. I  apologize for typographical errors.  Zara Council, [facility] [address], [place], [place] [address] [phone]

## 2016-12-01 ENCOUNTER — Other Ambulatory Visit: Payer: Self-pay | Admitting: *Deleted

## 2016-12-01 DIAGNOSIS — R300 Dysuria: Secondary | ICD-10-CM

## 2016-12-03 ENCOUNTER — Other Ambulatory Visit
Admission: RE | Admit: 2016-12-03 | Discharge: 2016-12-03 | Disposition: A | Discharge status: Home / Self Care | Payer: BC Managed Care – PPO | Source: Ambulatory Visit | Attending: Urology | Admitting: Urology

## 2016-12-03 ENCOUNTER — Ambulatory Visit (INDEPENDENT_AMBULATORY_CARE_PROVIDER_SITE_OTHER): Payer: BC Managed Care – PPO | Admitting: Urology

## 2016-12-03 ENCOUNTER — Encounter: Payer: Self-pay | Admitting: Urology

## 2016-12-03 VITALS — BP 138/60 | HR 90 | Ht 64.0 in | Wt 156.0 lb

## 2016-12-03 DIAGNOSIS — N390 Urinary tract infection, site not specified: Secondary | ICD-10-CM

## 2016-12-03 DIAGNOSIS — R300 Dysuria: Secondary | ICD-10-CM | POA: Diagnosis not present

## 2016-12-03 DIAGNOSIS — N952 Postmenopausal atrophic vaginitis: Secondary | ICD-10-CM

## 2016-12-03 DIAGNOSIS — R102 Pelvic and perineal pain: Secondary | ICD-10-CM

## 2016-12-03 LAB — URINALYSIS, COMPLETE (UACMP) WITH MICROSCOPIC
Bacteria, UA: NONE SEEN
Bilirubin Urine: NEGATIVE
Glucose, UA: NEGATIVE mg/dL
HGB URINE DIPSTICK: NEGATIVE
KETONES UR: NEGATIVE mg/dL
Leukocytes, UA: NEGATIVE
Nitrite: NEGATIVE
Protein, ur: NEGATIVE mg/dL
RBC / HPF: NONE SEEN RBC/hpf (ref 0–5)
SPECIFIC GRAVITY, URINE: 1.01 (ref 1.005–1.030)
WBC UA: NONE SEEN WBC/hpf (ref 0–5)
pH: 7 (ref 5.0–8.0)

## 2016-12-03 LAB — BLADDER SCAN AMB NON-IMAGING: SCAN RESULT: 56

## 2017-01-05 NOTE — Progress Notes (Signed)
01/07/2017 9:12 AM   Chilton Greathouse 04-10-1961 [id number]  Referring provider: [name], MD [place], [place] [address]  Chief Complaint  Patient presents with  . Vaginal Atrophy    1 month follow up    HPI: 56 yo WF with a history of recurrent UTI's, vaginal atrophy and pelvic floor dysfunction who presents today for a one month follow up.    Background history Patient is a 64 -year-old Caucasian female who is referred to [name] by, [name], for recurrent urinary tract infections.  Patient states that she has had 6 to 7 urinary tract infections over the last year.  Her symptoms with a urinary tract infection consist of the feeling of a full bladder, urgency, frequency and bilateral flank pain.  She denies dysuria, gross hematuria, back pain and abdominal pain.  She endorses suprapubic pain, back pain and flank pain. She has had recent chills and malaise, but she does not have fevers, nausea or vomiting.  Her baseline urinary symptoms are frequency, urgency, nocturia and incontinence with urgency.  She does not have a history of nephrolithiasis, GU surgery or GU trauma.  Reviewing her records,  she has had three documented UTI's with varying organisms over the last year.  She is not sexually active.  She is post menopausal.   She admits to constipation.  She does engage in good perineal hygiene. She does not take tub baths.  She is having pain with bladder filling.  Contrast CT study performed on 07/12/2013 noted no evidence of bowel obstruction.  Normal appendix.  Moderate stool in the left colon, raising the possibility of  constipation.  Otherwise, no CT findings to account for the patient's abdominal pain.   She is drinking one bottle of water daily.  She drinks mostly diet [facility] and sweat tea.  Her PVR was 56 mL.  UA was unremarkable.   At her initial visit, we discussed UTI prevention strategies, initiated vaginal estrogen cream and referred to physical  therapy.     She has cut back on her [facility] from 3 to 6 a day to 1 to 3.  She is trying to increase her water intake and is up to 2 big glasses a day.    Today, the patient has been experiencing urgency x 0-3, frequency x 4-7, not restricting fluids to avoid visits to the restroom, is engaging in toilet mapping, incontinence x 0-3 and nocturia x 0-3.    She has developed sciatic pain on her right hand side for which she was taking skeletal muscle relaxer's. She is now how to that medication and requesting refills.  PMH: Past Medical History:  Diagnosis Date  . 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 Arthritis 2012   osteo  . Depression   . GERD (gastroesophageal reflux disease)   . Hyperlipemia   . Interstitial cystitis 2006  . Peptic ulcer disease 2012  . PMB (postmenopausal bleeding) 2014  . Proteinuria 1980    Surgical History: Past Surgical History:  Procedure Laterality Date  . BILATERAL SALPINGECTOMY Bilateral 07/07/2015   Procedure: BILATERAL SALPINGECTOMY;  Surgeon: Boykin Nearing, MD;  Location: [facility] ORS;  Service: Gynecology;  Laterality: Bilateral;  . CHOLECYSTECTOMY    . TUBAL LIGATION    . VAGINAL HYSTERECTOMY N/A 07/07/2015   Procedure: HYSTERECTOMY VAGINAL;  Surgeon: Boykin Nearing, MD;  Location: [facility] ORS;  Service: Gynecology;  Laterality: N/A;    Home Medications:  Allergies as of 01/07/2017  Reactions   Penicillins Anaphylaxis, Rash   Etodolac Other (See Comments)   Mouth sores   Macrobid [nitrofurantoin Monohyd Macro] Itching   Meloxicam Itching   Premarin [conjugated Estrogens] Itching   Adhesive [tape] Rash   Paper tape ok to use.   Biaxin [clarithromycin] Rash      Medication List       Accurate as of 01/07/17  9:12 AM. Always use your most recent med list.          CALCIUM CARBONATE-VIT D-MIN PO Take 1 tablet by mouth 2 (two) times daily.   conjugated estrogens vaginal cream Commonly known as:  PREMARIN Place 1  Applicatorful vaginally daily. Apply 0.5mg  (pea-sized amount)  just inside the vaginal introitus with a finger-tip every night for two weeks and then Monday, Wednesday and Friday nights.   docusate sodium 100 MG capsule Commonly known as:  COLACE Take 1 capsule (100 mg total) by mouth 2 (two) times daily.   folic acid 1 MG tablet Commonly known as:  FOLVITE Take 1 mg by mouth daily.   ibuprofen 800 MG tablet Commonly known as:  ADVIL,MOTRIN Take 1 tablet (800 mg total) by mouth every 8 (eight) hours as needed.   Melatonin 1 MG Tabs Take 1 tablet by mouth at bedtime.   methotrexate 10 MG tablet Commonly known as:  RHEUMATREX Take 10 mg by mouth once a week. Caution: Chemotherapy. Protect from light.   naproxen 500 MG tablet Commonly known as:  NAPROSYN Take by mouth.   oxyCODONE-acetaminophen 5-325 MG tablet Commonly known as:  PERCOCET/ROXICET Take 1-2 tablets by mouth every 4 (four) hours as needed (moderate to severe pain (when tolerating fluids)).   pantoprazole 40 MG tablet Commonly known as:  PROTONIX Take 40 mg by mouth 2 (two) times daily.   Potassium 95 MG Tabs Take by mouth 2 (two) times daily.   sucralfate 1 GM/10ML suspension Commonly known as:  CARAFATE Take 1 g by mouth 2 (two) times daily. As needed.   venlafaxine 75 MG tablet Commonly known as:  EFFEXOR Take 75 mg by mouth every morning.   VITAMIN B 12 PO Take 1 tablet by mouth 2 (two) times daily.       Allergies:  Allergies  Allergen Reactions  . Penicillins Anaphylaxis and Rash  . Etodolac Other (See Comments)    Mouth sores  . Macrobid [Nitrofurantoin Monohyd Macro] Itching  . Meloxicam Itching  . Premarin [Conjugated Estrogens] Itching  . Adhesive [Tape] Rash    Paper tape ok to use.  . Biaxin [Clarithromycin] Rash    Family History: Family History  Problem Relation Age of Onset  . Breast cancer Paternal Grandmother        great gm  . Breast cancer Cousin 24       mat cousin    . Kidney disease Neg Hx   . Kidney cancer Neg Hx   . Prostate cancer Neg Hx     Social History:  reports that she quit smoking about 7 years ago. She has never used smokeless tobacco. She reports that she does not drink alcohol or use drugs.  ROS: UROLOGY Frequent Urination?: No Hard to postpone urination?: No Burning/pain with urination?: No Get up at night to urinate?: No Leakage of urine?: No Urine stream starts and stops?: No Trouble starting stream?: No Do you have to strain to urinate?: No Blood in urine?: No Urinary tract infection?: No Sexually transmitted disease?: No Injury to kidneys or bladder?: No Painful  intercourse?: No Weak stream?: No Currently pregnant?: No Vaginal bleeding?: No Last menstrual period?: n  Gastrointestinal Nausea?: No Vomiting?: No Indigestion/heartburn?: No Diarrhea?: No Constipation?: No  Constitutional Fever: No Night sweats?: No Weight loss?: No Fatigue?: Yes  Skin Skin rash/lesions?: No Itching?: No  Eyes Blurred vision?: No Double vision?: No  Ears/Nose/Throat Sore throat?: No Sinus problems?: No  Hematologic/Lymphatic Swollen glands?: No Easy bruising?: No  Cardiovascular Leg swelling?: No Chest pain?: No  Respiratory Cough?: No Shortness of breath?: No  Endocrine Excessive thirst?: Yes  Musculoskeletal Back pain?: Yes Joint pain?: Yes  Neurological Headaches?: No Dizziness?: No  Psychologic Depression?: No Anxiety?: Yes  Physical Exam: BP 132/66   Pulse 75   Ht 5\' 4"  (1.626 m)   Wt 154 lb (69.9 kg)   BMI 26.43 kg/m   Constitutional: Well nourished. Alert and oriented, No acute distress. HEENT: [place] AT, moist mucus membranes. Trachea midline, no masses. Cardiovascular: No clubbing, cyanosis, or edema. Respiratory: Normal respiratory effort, no increased work of breathing. GI: Abdomen is soft, non tender, non distended, no abdominal masses. Liver and spleen not palpable.  No hernias  appreciated.  Stool sample for occult testing is not indicated.   GU: No CVA tenderness.  No bladder fullness or masses.  Atrophic external genitalia, normal pubic hair distribution, no lesions. Normal urethral meatus, no lesions, no prolapse, no discharge.   No urethral masses, tenderness and/or tenderness. No bladder fullness, tenderness or masses. Normal vagina mucosa, good estrogen effect, no discharge, no lesions, good pelvic support, Grade III cystocele is noted.  Rectocele is not noted.  Cervix and uterus are surgically absent.  No adnexal/parametria masses or tenderness noted.  Tenderness throughout the pelvic floor on palpation.  Anus and perineum are without rashes or lesions.    Skin: No rashes, bruises or suspicious lesions. Lymph: No cervical or inguinal adenopathy. Neurologic: Grossly intact, no focal deficits, moving all 4 extremities. Psychiatric: Normal mood and affect.  Laboratory Data: Lab Results  Component Value Date   WBC 10.5 07/07/2015   HGB 12.8 07/07/2015   HCT 38.2 07/07/2015   MCV 94.8 07/07/2015   PLT 200 07/07/2015    Lab Results  Component Value Date   CREATININE 0.79 07/08/2015     Lab Results  Component Value Date   AST 38 (H) 09/03/2011   Lab Results  Component Value Date   ALT 49 09/03/2011      Assessment & Plan:    1. Recurrent UTI's  - Reviewed UTI prevention strategies  - She has not had an UTI since she saw [name] last month  - Asked the patient to contact us with symptoms of UTIs so that we may manage her  2. Vaginal atrophy  - Continue vaginal estrogen cream 3 nights weekly  - She will follow up in three months for an exam.    3. Pelvic floor dysfunction  - recommended PT for further evaluation and treatment - has an appointment in July  4. Sciatic   - patient's PCP has left the area but there is a provider in their place  - explained that I could not fill her muscle relaxer prescription as it is not in my pur vue, but to contact  her PCP or seek treatment at an Urgent care                              Return in about 3 months (around 04/09/2017)  for OAB questionnaire, PVR and exam.  These notes generated with voice recognition software. I apologize for typographical errors.  Zara Council, [facility] [address], [place], [place] [address] [phone]

## 2017-01-07 ENCOUNTER — Ambulatory Visit (INDEPENDENT_AMBULATORY_CARE_PROVIDER_SITE_OTHER): Payer: BC Managed Care – PPO | Admitting: Urology

## 2017-01-07 ENCOUNTER — Encounter: Payer: Self-pay | Admitting: Urology

## 2017-01-07 VITALS — BP 132/66 | HR 75 | Ht 64.0 in | Wt 154.0 lb

## 2017-01-07 DIAGNOSIS — Z87440 Personal history of urinary (tract) infections: Secondary | ICD-10-CM | POA: Diagnosis not present

## 2017-01-07 DIAGNOSIS — R102 Pelvic and perineal pain: Secondary | ICD-10-CM | POA: Diagnosis not present

## 2017-01-07 DIAGNOSIS — N952 Postmenopausal atrophic vaginitis: Secondary | ICD-10-CM

## 2017-01-07 DIAGNOSIS — M5430 Sciatica, unspecified side: Secondary | ICD-10-CM

## 2017-01-07 MED ORDER — ESTROGENS, CONJUGATED 0.625 MG/GM VA CREA: 1.0000 | TOPICAL_CREAM | Freq: Every day | VAGINAL | 12 refills | Status: DC

## 2017-01-10 ENCOUNTER — Ambulatory Visit: Payer: BC Managed Care – PPO

## 2017-03-07 ENCOUNTER — Ambulatory Visit: Payer: BC Managed Care – PPO | Admitting: Physical Therapy

## 2017-03-14 ENCOUNTER — Encounter: Payer: BC Managed Care – PPO | Admitting: Physical Therapy

## 2017-03-21 ENCOUNTER — Encounter: Payer: BC Managed Care – PPO | Admitting: Physical Therapy

## 2017-03-28 ENCOUNTER — Encounter: Payer: BC Managed Care – PPO | Admitting: Physical Therapy

## 2017-04-01 ENCOUNTER — Ambulatory Visit: Payer: BC Managed Care – PPO | Admitting: Urology

## 2017-04-11 ENCOUNTER — Encounter: Payer: BC Managed Care – PPO | Admitting: Physical Therapy

## 2017-04-25 ENCOUNTER — Encounter: Payer: BC Managed Care – PPO | Admitting: Physical Therapy

## 2017-05-09 ENCOUNTER — Encounter: Payer: BC Managed Care – PPO | Admitting: Physical Therapy

## 2017-06-07 ENCOUNTER — Other Ambulatory Visit: Payer: Self-pay | Admitting: Obstetrics and Gynecology

## 2017-06-07 DIAGNOSIS — Z1231 Encounter for screening mammogram for malignant neoplasm of breast: Secondary | ICD-10-CM

## 2017-07-05 ENCOUNTER — Ambulatory Visit
Admission: RE | Admit: 2017-07-05 | Discharge: 2017-07-05 | Disposition: A | Discharge status: Home / Self Care | Payer: BC Managed Care – PPO | Source: Ambulatory Visit | Attending: Obstetrics and Gynecology | Admitting: Obstetrics and Gynecology

## 2017-07-05 DIAGNOSIS — Z1231 Encounter for screening mammogram for malignant neoplasm of breast: Secondary | ICD-10-CM | POA: Diagnosis present

## 2017-12-05 ENCOUNTER — Other Ambulatory Visit
Admission: RE | Admit: 2017-12-05 | Discharge: 2017-12-05 | Disposition: A | Discharge status: Home / Self Care | Payer: BC Managed Care – PPO | Source: Ambulatory Visit | Attending: Nurse Practitioner | Admitting: Nurse Practitioner

## 2017-12-05 DIAGNOSIS — R197 Diarrhea, unspecified: Secondary | ICD-10-CM | POA: Insufficient documentation

## 2017-12-05 LAB — GASTROINTESTINAL PANEL BY PCR, STOOL (REPLACES STOOL CULTURE)

## 2017-12-05 LAB — C DIFFICILE QUICK SCREEN W PCR REFLEX
C Diff antigen: NEGATIVE
C Diff interpretation: NOT DETECTED
C Diff toxin: NEGATIVE

## 2017-12-07 LAB — CALPROTECTIN, FECAL: Calprotectin, Fecal: 580 ug/g — ABNORMAL HIGH (ref 0–120)

## 2017-12-07 LAB — PANCREATIC ELASTASE, FECAL

## 2017-12-09 ENCOUNTER — Other Ambulatory Visit: Payer: Self-pay | Admitting: Nurse Practitioner

## 2017-12-09 DIAGNOSIS — R197 Diarrhea, unspecified: Secondary | ICD-10-CM

## 2017-12-09 DIAGNOSIS — R1084 Generalized abdominal pain: Secondary | ICD-10-CM

## 2017-12-16 ENCOUNTER — Other Ambulatory Visit
Admission: RE | Admit: 2017-12-16 | Discharge: 2017-12-16 | Disposition: A | Discharge status: Home / Self Care | Payer: BC Managed Care – PPO | Source: Ambulatory Visit | Attending: Nurse Practitioner | Admitting: Nurse Practitioner

## 2017-12-16 DIAGNOSIS — R197 Diarrhea, unspecified: Secondary | ICD-10-CM | POA: Diagnosis present

## 2017-12-16 DIAGNOSIS — R1084 Generalized abdominal pain: Secondary | ICD-10-CM | POA: Diagnosis not present

## 2017-12-16 LAB — LACTOFERRIN, FECAL, QUALITATIVE: Lactoferrin, Fecal, Qual: POSITIVE — AB

## 2017-12-19 ENCOUNTER — Ambulatory Visit
Admission: RE | Admit: 2017-12-19 | Discharge: 2017-12-19 | Disposition: A | Discharge status: Home / Self Care | Payer: BC Managed Care – PPO | Source: Ambulatory Visit | Attending: Nurse Practitioner | Admitting: Nurse Practitioner

## 2017-12-22 ENCOUNTER — Ambulatory Visit: Payer: BC Managed Care – PPO

## 2019-01-03 ENCOUNTER — Other Ambulatory Visit: Payer: Self-pay | Admitting: Gerontology

## 2019-01-03 DIAGNOSIS — Z0000 Encounter for general adult medical examination without abnormal findings: Secondary | ICD-10-CM

## 2019-04-25 ENCOUNTER — Ambulatory Visit
Admission: RE | Admit: 2019-04-25 | Discharge: 2019-04-25 | Disposition: A | Discharge status: Home / Self Care | Payer: BC Managed Care – PPO | Source: Ambulatory Visit | Attending: Gerontology | Admitting: Gerontology

## 2019-04-25 ENCOUNTER — Other Ambulatory Visit: Payer: Self-pay | Admitting: Gerontology

## 2019-04-25 ENCOUNTER — Other Ambulatory Visit: Payer: Self-pay

## 2019-04-25 ENCOUNTER — Encounter (INDEPENDENT_AMBULATORY_CARE_PROVIDER_SITE_OTHER): Payer: Self-pay

## 2019-04-25 DIAGNOSIS — R748 Abnormal levels of other serum enzymes: Secondary | ICD-10-CM

## 2019-04-25 DIAGNOSIS — R112 Nausea with vomiting, unspecified: Secondary | ICD-10-CM

## 2019-04-25 DIAGNOSIS — R5383 Other fatigue: Secondary | ICD-10-CM | POA: Diagnosis present

## 2019-04-25 DIAGNOSIS — R5381 Other malaise: Secondary | ICD-10-CM

## 2019-04-25 DIAGNOSIS — R519 Headache, unspecified: Secondary | ICD-10-CM

## 2019-04-25 DIAGNOSIS — R51 Headache: Secondary | ICD-10-CM | POA: Insufficient documentation

## 2019-04-25 MED ORDER — IOHEXOL 300 MG/ML  SOLN
100.0000 mL | Freq: Once | INTRAMUSCULAR | Status: AC | PRN
  Administered 2019-04-25: 15:00:00 100 mL via INTRAVENOUS

## 2020-01-03 ENCOUNTER — Other Ambulatory Visit: Payer: Self-pay

## 2020-01-03 MED ORDER — DAPSONE 7.5 % EX GEL: 1.0000 "application " | Freq: Every day | CUTANEOUS | 0 refills | Status: DC

## 2020-01-03 MED ORDER — PIMECROLIMUS 1 % EX CREA: TOPICAL_CREAM | Freq: Two times a day (BID) | CUTANEOUS | 0 refills | Status: DC

## 2020-01-03 NOTE — Telephone Encounter (Signed)
RFs of Pimecrolimus and Dapsone sent in to help patient keep covered until her appointment in August.

## 2020-04-14 ENCOUNTER — Encounter: Payer: BC Managed Care – PPO | Admitting: Dermatology

## 2020-05-22 ENCOUNTER — Encounter: Payer: Self-pay | Admitting: Dermatology

## 2020-05-22 ENCOUNTER — Other Ambulatory Visit: Payer: Self-pay

## 2020-05-22 ENCOUNTER — Ambulatory Visit: Payer: BC Managed Care – PPO | Admitting: Dermatology

## 2020-05-22 DIAGNOSIS — I8393 Asymptomatic varicose veins of bilateral lower extremities: Secondary | ICD-10-CM | POA: Diagnosis not present

## 2020-05-22 DIAGNOSIS — L820 Inflamed seborrheic keratosis: Secondary | ICD-10-CM

## 2020-05-22 DIAGNOSIS — D1800 Hemangioma unspecified site: Secondary | ICD-10-CM

## 2020-05-22 DIAGNOSIS — D229 Melanocytic nevi, unspecified: Secondary | ICD-10-CM

## 2020-05-22 DIAGNOSIS — L719 Rosacea, unspecified: Secondary | ICD-10-CM | POA: Diagnosis not present

## 2020-05-22 DIAGNOSIS — L918 Other hypertrophic disorders of the skin: Secondary | ICD-10-CM

## 2020-05-22 DIAGNOSIS — L821 Other seborrheic keratosis: Secondary | ICD-10-CM

## 2020-05-22 DIAGNOSIS — Z1283 Encounter for screening for malignant neoplasm of skin: Secondary | ICD-10-CM

## 2020-05-22 DIAGNOSIS — L814 Other melanin hyperpigmentation: Secondary | ICD-10-CM

## 2020-05-22 DIAGNOSIS — L578 Other skin changes due to chronic exposure to nonionizing radiation: Secondary | ICD-10-CM

## 2020-05-22 NOTE — Patient Instructions (Addendum)
Wound Care Instructions  1. Cleanse wound gently with soap and water once a day then pat dry with clean gauze. Apply a thing coat of Petrolatum (petroleum jelly, "Vaseline") over the wound (unless you have an allergy to this). We recommend that you use a new, sterile tube of Vaseline. Do not pick or remove scabs. Do not remove the yellow or white "healing tissue" from the base of the wound.  2. Cover the wound with fresh, clean, nonstick gauze and secure with paper tape. You may use Band-Aids in place of gauze and tape if the would is small enough, but would recommend trimming much of the tape off as there is often too much. Sometimes Band-Aids can irritate the skin.  3. You should call the office for your biopsy report after 1 week if you have not already been contacted.  4. If you experience any problems, such as abnormal amounts of bleeding, swelling, significant bruising, significant pain, or evidence of infection, please call the office immediately.  5. FOR ADULT SURGERY PATIENTS: If you need something for pain relief you may take 1 extra strength Tylenol (acetaminophen) AND 2 Ibuprofen (200mg each) together every 4 hours as needed for pain. (do not take these if you are allergic to them or if you have a reason you should not take them.) Typically, you may only need pain medication for 1 to 3 days.   Instructions for [facility] Medications  One or more of your medications was sent to the [facility] mail order compounding pharmacy. You will receive an email from them and can purchase the medicine through that link. It will then be mailed to your home at the address you confirmed. If for any reason you do not receive an email from them, please check your spam folder. If you still do not find the email, please let us know. [facility] phone number is [phone].   

## 2020-05-22 NOTE — Progress Notes (Signed)
Follow-Up Visit   Subjective  Amy Hammond is a 59 y.o. female who presents for the following: Annual Exam (Total body skin exam - no history of skin cancer or abnormal moles). The patient presents for Total-Body Skin Exam (TBSE) for skin cancer screening and mole check.  The following portions of the chart were reviewed this encounter and updated as appropriate:  Tobacco   Allergies   Meds   Problems   Med Hx   Surg Hx   Fam Hx      Review of Systems:  No other skin or systemic complaints except as noted in HPI or Assessment and Plan.  Objective  Well appearing patient in no apparent distress; mood and affect are within normal limits.  A full examination was performed including scalp, head, eyes, ears, nose, lips, neck, chest, axillae, abdomen, back, buttocks, bilateral upper extremities, bilateral lower extremities, hands, feet, fingers, toes, fingernails, and toenails. All findings within normal limits unless otherwise noted below.  Objective  Trunk/extremities (7): Erythematous keratotic or waxy stuck-on papule or plaque.   Objective  Bilateral lower legs: Spider veins  Objective  Head - Anterior (Face): Pink papule and dilated blood vessels   Assessment & Plan    Lentigines - Scattered tan macules - Discussed due to sun exposure - Benign, observe - Call for any changes  Seborrheic Keratoses - Stuck-on, waxy, tan-brown papules and plaques  - Discussed benign etiology and prognosis. - Observe - Call for any changes  Melanocytic Nevi - Tan-brown and/or pink-flesh-colored symmetric macules and papules - Benign appearing on exam today - Observation - Call clinic for new or changing moles - Recommend daily use of broad spectrum spf 30+ sunscreen to sun-exposed areas.   Hemangiomas - Red papules - Discussed benign nature - Observe - Call for any changes  Actinic Damage - diffuse scaly erythematous macules with underlying dyspigmentation - Recommend daily  broad spectrum sunscreen SPF 30+ to sun-exposed areas, reapply every 2 hours as needed.  - Call for new or changing lesions.  Skin cancer screening performed today.  Acrochordons (Skin Tags) - Fleshy, skin-colored pedunculated papules - Benign appearing.  - Observe. - If desired, they can be removed with an in office procedure that is not covered by insurance. - Please call the clinic if you notice any new or changing lesions.   Inflamed seborrheic keratosis (7) Trunk/extremities  Destruction of lesion - Trunk/extremities Complexity: simple   Destruction method: cryotherapy   Informed consent: discussed and consent obtained   Timeout:  patient name, date of birth, surgical site, and procedure verified Lesion destroyed using liquid nitrogen: Yes   Region frozen until ice ball extended beyond lesion: Yes   Outcome: patient tolerated procedure well with no complications   Post-procedure details: wound care instructions given    Spider veins of both lower extremities Bilateral lower legs Symptomatic veins Discussed evaluation with vascular surgeon - patient declines today.  Rosacea Head - Anterior (Face) Rosacea/Acne/Perioral Dermatitis Continue Dapsone gel qd.   Start [facility] metronidazole/ivermectin/azelaic acid twice daily as needed to affected areas on the face. The patient was advised this is not covered by insurance. They will receive an email to check out and the medication will be mailed to their home.   May consider Doxycycline in the future if not improving.  Return in about 1 year (around 05/22/2021).  I, Ashok Cordia, CMA, am acting as scribe for Sarina Ser, MD .  Documentation: I have reviewed the above documentation for  accuracy and completeness, and I agree with the above.  Sarina Ser, MD

## 2020-11-27 ENCOUNTER — Other Ambulatory Visit: Payer: Self-pay | Admitting: Obstetrics and Gynecology

## 2020-11-27 DIAGNOSIS — Z1231 Encounter for screening mammogram for malignant neoplasm of breast: Secondary | ICD-10-CM

## 2020-12-10 ENCOUNTER — Other Ambulatory Visit: Payer: Self-pay

## 2020-12-10 ENCOUNTER — Ambulatory Visit
Admission: RE | Admit: 2020-12-10 | Discharge: 2020-12-10 | Disposition: A | Discharge status: Home / Self Care | Payer: Self-pay | Source: Ambulatory Visit | Attending: Obstetrics and Gynecology | Admitting: Obstetrics and Gynecology

## 2020-12-10 DIAGNOSIS — Z1231 Encounter for screening mammogram for malignant neoplasm of breast: Secondary | ICD-10-CM | POA: Insufficient documentation

## 2021-02-11 ENCOUNTER — Other Ambulatory Visit: Payer: Self-pay | Admitting: Gastroenterology

## 2021-02-11 DIAGNOSIS — R140 Abdominal distension (gaseous): Secondary | ICD-10-CM

## 2021-02-11 DIAGNOSIS — K219 Gastro-esophageal reflux disease without esophagitis: Secondary | ICD-10-CM

## 2021-03-20 ENCOUNTER — Ambulatory Visit: Admission: RE | Admit: 2021-03-20 | Payer: BC Managed Care – PPO | Source: Ambulatory Visit

## 2021-05-28 ENCOUNTER — Other Ambulatory Visit: Payer: Self-pay

## 2021-05-28 ENCOUNTER — Ambulatory Visit: Payer: BC Managed Care – PPO | Admitting: Dermatology

## 2021-05-28 DIAGNOSIS — D229 Melanocytic nevi, unspecified: Secondary | ICD-10-CM

## 2021-05-28 DIAGNOSIS — L819 Disorder of pigmentation, unspecified: Secondary | ICD-10-CM

## 2021-05-28 DIAGNOSIS — L719 Rosacea, unspecified: Secondary | ICD-10-CM

## 2021-05-28 DIAGNOSIS — L578 Other skin changes due to chronic exposure to nonionizing radiation: Secondary | ICD-10-CM | POA: Diagnosis not present

## 2021-05-28 DIAGNOSIS — L918 Other hypertrophic disorders of the skin: Secondary | ICD-10-CM

## 2021-05-28 DIAGNOSIS — D1800 Hemangioma unspecified site: Secondary | ICD-10-CM

## 2021-05-28 DIAGNOSIS — Z1283 Encounter for screening for malignant neoplasm of skin: Secondary | ICD-10-CM | POA: Diagnosis not present

## 2021-05-28 DIAGNOSIS — L821 Other seborrheic keratosis: Secondary | ICD-10-CM

## 2021-05-28 DIAGNOSIS — L820 Inflamed seborrheic keratosis: Secondary | ICD-10-CM

## 2021-05-28 MED ORDER — DAPSONE 7.5 % EX GEL
1.0000 | Freq: Every morning | CUTANEOUS | 11 refills | Status: DC
Start: 2021-05-28 — End: 2022-07-08

## 2021-05-28 NOTE — Patient Instructions (Signed)

## 2021-05-28 NOTE — Progress Notes (Signed)
Follow-Up Visit   Subjective  Amy Hammond is a 60 y.o. female who presents for the following: Total body skin exam (Check spot R cheek, 64m, itchy/Growth R ant thigh, yrs, enlarging) and Rosacea (Face, Dapsone 7.5% gel qd, SM Triple cream). The patient presents for Total-Body Skin Exam (TBSE) for skin cancer screening and mole check.  The following portions of the chart were reviewed this encounter and updated as appropriate:   Tobacco  Allergies  Meds  Problems  Med Hx  Surg Hx  Fam Hx     Review of Systems:  No other skin or systemic complaints except as noted in HPI or Assessment and Plan.  Objective  Well appearing patient in no apparent distress; mood and affect are within normal limits.  A full examination was performed including scalp, head, eyes, ears, nose, lips, neck, chest, axillae, abdomen, back, buttocks, bilateral upper extremities, bilateral lower extremities, hands, feet, fingers, toes, fingernails, and toenails. All findings within normal limits unless otherwise noted below.  face Mild erythremia face  R cheek x 1, Total = 1 Erythematous keratotic or waxy stuck-on papule or plaque.    Assessment & Plan   Lentigines - Scattered tan macules - Due to sun exposure - Benign-appearing, observe - Recommend daily broad spectrum sunscreen SPF 30+ to sun-exposed areas, reapply every 2 hours as needed. - Call for any changes  Seborrheic Keratoses - Stuck-on, waxy, tan-brown papules and/or plaques  - Benign-appearing - Discussed benign etiology and prognosis. - Observe - Call for any changes - R ant thigh, face, trunk  Melanocytic Nevi - Tan-brown and/or pink-flesh-colored symmetric macules and papules - Benign appearing on exam today - Observation - Call clinic for new or changing moles - Recommend daily use of broad spectrum spf 30+ sunscreen to sun-exposed areas.   Hemangiomas - Red papules - Discussed benign nature - Observe - Call for any  changes  Actinic Damage - Chronic condition, secondary to cumulative UV/sun exposure - diffuse scaly erythematous macules with underlying dyspigmentation - Recommend daily broad spectrum sunscreen SPF 30+ to sun-exposed areas, reapply every 2 hours as needed.  - Staying in the shade or wearing long sleeves, sun glasses (UVA+UVB protection) and wide brim hats (4-inch brim around the entire circumference of the hat) are also recommended for sun protection.  - Call for new or changing lesions.  Skin cancer screening performed today.  Acrochordons (Skin Tags) - Fleshy, skin-colored pedunculated papules - Benign appearing.  - Observe. - If desired, they can be removed with an in office procedure that is not covered by insurance. - Please call the clinic if you notice any new or changing lesions.   Rosacea face  Rosacea is a chronic progressive skin condition usually affecting the face of adults, causing redness and/or acne bumps. It is treatable but not curable. It sometimes affects the eyes (ocular rosacea) as well. It may respond to topical and/or systemic medication and can flare with stress, sun exposure, alcohol, exercise and some foods.  Daily application of broad spectrum spf 30+ sunscreen to face is recommended to reduce flares.  Cont [facility] Triple cream qd Cont Dapsone 7.5% gel qam  Dapsone 7.5 % GEL - face Apply 1 application topically every morning. Qam to face for Rosacea  Inflamed seborrheic keratosis R cheek x 1, Total = 1  Destruction of lesion - R cheek x 1, Total = 1 Complexity: simple   Destruction method: cryotherapy   Informed consent: discussed and consent obtained  Timeout:  patient name, date of birth, surgical site, and procedure verified Lesion destroyed using liquid nitrogen: Yes   Region frozen until ice ball extended beyond lesion: Yes   Outcome: patient tolerated procedure well with no complications   Post-procedure details: wound care  instructions given    Return in about 1 year (around 05/28/2022) for TBSE.  I, Othelia Pulling, RMA, am acting as scribe for Sarina Ser, MD . Documentation: I have reviewed the above documentation for accuracy and completeness, and I agree with the above.  Sarina Ser, MD

## 2021-05-29 ENCOUNTER — Encounter: Payer: Self-pay | Admitting: Dermatology

## 2022-01-13 ENCOUNTER — Other Ambulatory Visit: Payer: Self-pay | Admitting: Obstetrics and Gynecology

## 2022-01-13 DIAGNOSIS — Z1231 Encounter for screening mammogram for malignant neoplasm of breast: Secondary | ICD-10-CM

## 2022-01-26 ENCOUNTER — Ambulatory Visit
Admission: RE | Admit: 2022-01-26 | Discharge: 2022-01-26 | Disposition: A | Discharge status: Home / Self Care | Payer: BC Managed Care – PPO | Source: Ambulatory Visit | Attending: Obstetrics and Gynecology | Admitting: Obstetrics and Gynecology

## 2022-01-26 DIAGNOSIS — Z1231 Encounter for screening mammogram for malignant neoplasm of breast: Secondary | ICD-10-CM | POA: Diagnosis present

## 2022-06-03 ENCOUNTER — Ambulatory Visit: Payer: BC Managed Care – PPO | Admitting: Dermatology

## 2022-07-08 ENCOUNTER — Ambulatory Visit: Payer: BC Managed Care – PPO | Admitting: Dermatology

## 2022-07-08 DIAGNOSIS — L719 Rosacea, unspecified: Secondary | ICD-10-CM | POA: Diagnosis not present

## 2022-07-08 DIAGNOSIS — L649 Androgenic alopecia, unspecified: Secondary | ICD-10-CM | POA: Diagnosis not present

## 2022-07-08 DIAGNOSIS — Z1283 Encounter for screening for malignant neoplasm of skin: Secondary | ICD-10-CM

## 2022-07-08 DIAGNOSIS — L578 Other skin changes due to chronic exposure to nonionizing radiation: Secondary | ICD-10-CM

## 2022-07-08 DIAGNOSIS — D229 Melanocytic nevi, unspecified: Secondary | ICD-10-CM

## 2022-07-08 DIAGNOSIS — L821 Other seborrheic keratosis: Secondary | ICD-10-CM

## 2022-07-08 DIAGNOSIS — L818 Other specified disorders of pigmentation: Secondary | ICD-10-CM

## 2022-07-08 DIAGNOSIS — L814 Other melanin hyperpigmentation: Secondary | ICD-10-CM

## 2022-07-08 MED ORDER — MINOXIDIL 2.5 MG PO TABS: 2.5000 mg | ORAL_TABLET | Freq: Every day | ORAL | 3 refills | Status: DC

## 2022-07-08 NOTE — Patient Instructions (Signed)
Due to recent changes in healthcare laws, you may see results of your pathology and/or laboratory studies on MyChart before the doctors have had a chance to review them. We understand that in some cases there may be results that are confusing or concerning to you. Please understand that not all results are received at the same time and often the doctors may need to interpret multiple results in order to provide you with the best plan of care or course of treatment. Therefore, we ask that you please give us 2 business days to thoroughly review all your results before contacting the office for clarification. Should we see a critical lab result, you will be contacted sooner.   If You Need Anything After Your Visit  If you have any questions or concerns for your doctor, please call our main line at [phone] and press option 4 to reach your doctor's medical assistant. If no one answers, please leave a voicemail as directed and we will return your call as soon as possible. Messages left after 4 pm will be answered the following business day.   You may also send us a message via MyChart. We typically respond to MyChart messages within 1-2 business days.  For prescription refills, please ask your pharmacy to contact our office. Our fax number is [phone].  If you have an urgent issue when the clinic is closed that cannot wait until the next business day, you can page your doctor at the number below.    Please note that while we do our best to be available for urgent issues outside of office hours, we are not available 24/7.   If you have an urgent issue and are unable to reach us, you may choose to seek medical care at your doctor's office, retail clinic, urgent care center, or emergency room.  If you have a medical emergency, please immediately call 911 or go to the emergency department.  Pager Numbers  - Dr. Kowalski: [phone]  - Dr. Moye: [phone]  - Dr. Stewart:  [phone]  In the event of inclement weather, please call our main line at [phone] for an update on the status of any delays or closures.  Dermatology Medication Tips: Please keep the boxes that topical medications come in in order to help keep track of the instructions about where and how to use these. Pharmacies typically print the medication instructions only on the boxes and not directly on the medication tubes.   If your medication is too expensive, please contact our office at [phone] option 4 or send us a message through MyChart.   We are unable to tell what your co-pay for medications will be in advance as this is different depending on your insurance coverage. However, we may be able to find a substitute medication at lower cost or fill out paperwork to get insurance to cover a needed medication.   If a prior authorization is required to get your medication covered by your insurance company, please allow us 1-2 business days to complete this process.  Drug prices often vary depending on where the prescription is filled and some pharmacies may offer cheaper prices.  The website www.goodrx.com contains coupons for medications through different pharmacies. The prices here do not account for what the cost may be with help from insurance (it may be cheaper with your insurance), but the website can give you the price if you did not use any insurance.  - You can print the associated coupon and take it with   your prescription to the pharmacy.  - You may also stop by our office during regular business hours and pick up a GoodRx coupon card.  - If you need your prescription sent electronically to a different pharmacy, notify our office through [facility] MyChart or by phone at [phone] option 4.     Si Usted Necesita Algo Despus de Su Visita  Tambin puede enviarnos un mensaje a travs de MyChart. Por lo general respondemos a los mensajes de MyChart en el transcurso de 1 a 2  das hbiles.  Para renovar recetas, por favor pida a su farmacia que se ponga en contacto con nuestra oficina. Nuestro nmero de fax es el [phone].  Si tiene un asunto urgente cuando la clnica est cerrada y que no puede esperar hasta el siguiente da hbil, puede llamar/localizar a su doctor(a) al nmero que aparece a continuacin.   Por favor, tenga en cuenta que aunque hacemos todo lo posible para estar disponibles para asuntos urgentes fuera del horario de oficina, no estamos disponibles las 24 horas del da, los 7 das de la semana.   Si tiene un problema urgente y no puede comunicarse con nosotros, puede optar por buscar atencin mdica  en el consultorio de su doctor(a), en una clnica privada, en un centro de atencin urgente o en una sala de emergencias.  Si tiene una emergencia mdica, por favor llame inmediatamente al 911 o vaya a la sala de emergencias.  Nmeros de bper  - Dr. Kowalski: [phone]  - Dra. Moye: [phone]  - Dra. Stewart: [phone]  En caso de inclemencias del tiempo, por favor llame a nuestra lnea principal al [phone] para una actualizacin sobre el estado de cualquier retraso o cierre.  Consejos para la medicacin en dermatologa: Por favor, guarde las cajas en las que vienen los medicamentos de uso tpico para ayudarle a seguir las instrucciones sobre dnde y cmo usarlos. Las farmacias generalmente imprimen las instrucciones del medicamento slo en las cajas y no directamente en los tubos del medicamento.   Si su medicamento es muy caro, por favor, pngase en contacto con nuestra oficina llamando al [phone] y presione la opcin 4 o envenos un mensaje a travs de MyChart.   No podemos decirle cul ser su copago por los medicamentos por adelantado ya que esto es diferente dependiendo de la cobertura de su seguro. Sin embargo, es posible que podamos encontrar un medicamento sustituto a menor costo o llenar un formulario para que el  seguro cubra el medicamento que se considera necesario.   Si se requiere una autorizacin previa para que su compaa de seguros cubra su medicamento, por favor permtanos de 1 a 2 das hbiles para completar este proceso.  Los precios de los medicamentos varan con frecuencia dependiendo del lugar de dnde se surte la receta y alguna farmacias pueden ofrecer precios ms baratos.  El sitio web www.goodrx.com tiene cupones para medicamentos de diferentes farmacias. Los precios aqu no tienen en cuenta lo que podra costar con la ayuda del seguro (puede ser ms barato con su seguro), pero el sitio web puede darle el precio si no utiliz ningn seguro.  - Puede imprimir el cupn correspondiente y llevarlo con su receta a la farmacia.  - Tambin puede pasar por nuestra oficina durante el horario de atencin regular y recoger una tarjeta de cupones de GoodRx.  - Si necesita que su receta se enve electrnicamente a una farmacia diferente, informe a nuestra oficina a travs de MyChart de [facility]   o por telfono llamando al [phone] y presione la opcin 4.  

## 2022-07-08 NOTE — Progress Notes (Signed)
Follow-Up Visit   Subjective  Amy Hammond is a 62 y.o. female who presents for the following: Annual Exam (No history of skin cancer or abnormal moles - The patient presents for Total-Body Skin Exam (TBSE) for skin cancer screening and mole check.  The patient has spots, moles and lesions to be evaluated, some may be new or changing and the patient has concerns that these could be cancer./).  The following portions of the chart were reviewed this encounter and updated as appropriate:   Tobacco  Allergies  Meds  Problems  Med Hx  Surg Hx  Fam Hx     Review of Systems:  No other skin or systemic complaints except as noted in HPI or Assessment and Plan.  Objective  Well appearing patient in no apparent distress; mood and affect are within normal limits.  A full examination was performed including scalp, head, eyes, ears, nose, lips, neck, chest, axillae, abdomen, back, buttocks, bilateral upper extremities, bilateral lower extremities, hands, feet, fingers, toes, fingernails, and toenails. All findings within normal limits unless otherwise noted below.  Scalp Diffuse thinning of the crown and widening of the midline part with retention of the frontal hairline - Reviewed progressive nature and prognosis.   B/P today - 165/81           Arms Hypopigmented macules   Assessment & Plan   Lentigines - Scattered tan macules - Due to sun exposure - Benign-appearing, observe - Recommend daily broad spectrum sunscreen SPF 30+ to sun-exposed areas, reapply every 2 hours as needed. - Call for any changes  Seborrheic Keratoses - Stuck-on, waxy, tan-brown papules and/or plaques  - Benign-appearing - Discussed benign etiology and prognosis. - Observe - Call for any changes  Melanocytic Nevi - Tan-brown and/or pink-flesh-colored symmetric macules and papules - Benign appearing on exam today - Observation - Call clinic for new or changing moles - Recommend daily use of  broad spectrum spf 30+ sunscreen to sun-exposed areas.   Hemangiomas - Red papules - Discussed benign nature - Observe - Call for any changes  Actinic Damage - Chronic condition, secondary to cumulative UV/sun exposure - diffuse scaly erythematous macules with underlying dyspigmentation - Recommend daily broad spectrum sunscreen SPF 30+ to sun-exposed areas, reapply every 2 hours as needed.  - Staying in the shade or wearing long sleeves, sun glasses (UVA+UVB protection) and wide brim hats (4-inch brim around the entire circumference of the hat) are also recommended for sun protection.  - Call for new or changing lesions.  Skin cancer screening performed today.  Androgenetic alopecia Scalp Female Androgenic Alopecia is a chronic condition related to genetics and/or hormonal changes.  In women androgenetic alopecia is commonly associated with menopause but may occur any time after puberty.  It causes hair thinning primarily on the crown with widening of the part and temporal hairline recession.  Can use OTC Rogaine (minoxidil) 5% solution/foam as directed.  Oral treatments in female patients who have no contraindication may include : - Low dose oral minoxidil 1.25 - '5mg'$  daily - Spironolactone 50 - '100mg'$  bid - Finasteride 2.5 - 5 mg daily Adjunctive therapies include: - Low Level Laser Light Therapy (LLLT) - Platelet-rich plasma injections (PRP) - Hair Transplants or scalp reduction   Start Minoxidil 2.5 mg 1/2 tab po qd  minoxidil (LONITEN) 2.5 MG tablet - Scalp Take 1 tablet (2.5 mg total) by mouth daily.  Idiopathic guttate hypomelanosis Arms Benign-appearing.  Observation.  Call clinic for new or changing  lesions.  Recommend daily use of broad spectrum spf 30+ sunscreen to sun-exposed areas.    Rosacea Head - Anterior (Face) Rosacea is a chronic progressive skin condition usually affecting the face of adults, causing redness and/or acne bumps. It is treatable but not  curable. It sometimes affects the eyes (ocular rosacea) as well. It may respond to topical and/or systemic medication and can flare with stress, sun exposure, alcohol, exercise, topical steroids (including hydrocortisone/cortisone 10) and some foods.  Daily application of broad spectrum spf 30+ sunscreen to face is recommended to reduce flares.  Continue Skin Medicinals metronidazole/ivermectin/azelaic acid twice daily as needed to affected areas on the face.   Return in about 6 months (around 01/06/2023) for Follow up Alopecia.  I, Ashok Cordia, CMA, am acting as scribe for Sarina Ser, MD . Documentation: I have reviewed the above documentation for accuracy and completeness, and I agree with the above.  Sarina Ser, MD

## 2022-07-17 ENCOUNTER — Encounter: Payer: Self-pay | Admitting: Dermatology

## 2022-08-05 ENCOUNTER — Other Ambulatory Visit: Payer: Self-pay | Admitting: Nurse Practitioner

## 2022-08-05 DIAGNOSIS — K219 Gastro-esophageal reflux disease without esophagitis: Secondary | ICD-10-CM

## 2022-08-05 DIAGNOSIS — R1012 Left upper quadrant pain: Secondary | ICD-10-CM

## 2022-08-27 ENCOUNTER — Encounter: Payer: BC Managed Care – PPO | Attending: Nurse Practitioner

## 2022-09-10 ENCOUNTER — Encounter
Admission: RE | Admit: 2022-09-10 | Discharge: 2022-09-10 | Disposition: A | Discharge status: Home / Self Care | Payer: BC Managed Care – PPO | Source: Ambulatory Visit | Attending: Nurse Practitioner | Admitting: Nurse Practitioner

## 2022-09-10 DIAGNOSIS — K219 Gastro-esophageal reflux disease without esophagitis: Secondary | ICD-10-CM | POA: Insufficient documentation

## 2022-09-10 DIAGNOSIS — R1012 Left upper quadrant pain: Secondary | ICD-10-CM | POA: Diagnosis not present

## 2022-09-10 MED ORDER — TECHNETIUM TC 99M SULFUR COLLOID
2.0000 | Freq: Once | INTRAVENOUS | Status: AC | PRN
  Administered 2022-09-10: 2.16 via INTRAVENOUS

## 2022-10-07 ENCOUNTER — Other Ambulatory Visit: Payer: Self-pay | Admitting: Dermatology

## 2022-10-07 ENCOUNTER — Telehealth: Payer: Self-pay

## 2022-10-07 DIAGNOSIS — L649 Androgenic alopecia, unspecified: Secondary | ICD-10-CM

## 2022-10-07 NOTE — Telephone Encounter (Signed)
Pt called needing refills of Minoxidil.  I advised she should have been taking the Minoxidil 2.'5mg'$  1/2 pill a day and she has been taking a 1 po qd.  After discussing with Dr. Nehemiah Massed advised pt she could continue Minoxidil 2.'5mg'$  1 po qd.  Sent refills in for patient./sh

## 2022-12-12 DIAGNOSIS — K551 Chronic vascular disorders of intestine: Secondary | ICD-10-CM | POA: Insufficient documentation

## 2022-12-12 NOTE — Progress Notes (Signed)
MRN : [id number]  Amy Hammond is a 63 y.o. (1961/04/08) female who presents with chief complaint of check circulation.  History of Present Illness:   I am asked to evaluate the patient for the complaint of abdominal pain with uncertain etiology.  The patient is followed the GI service, who is concerned about chronic mesenteric ischemia.  The patient has noted some weight loss as well as nausea.  The patient does substantiate food fear, particular foods do not seem to aggravate or alleviate the symptoms.  The patient denies bloody bowel movements or diarrhea.    No prior peripheral angiograms or vascular interventions.  The patient denies amaurosis fugax or recent TIA symptoms. There are no recent neurological changes noted. The patient denies claudication symptoms or rest pain symptoms. The patient denies history of DVT, PE or superficial thrombophlebitis. 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    Past Medical History:  Diagnosis Date   Amyopathic dermatomyositis (HCC) 2012   Arthritis 2012   osteo   Depression    GERD (gastroesophageal reflux disease)    Hyperlipemia    Interstitial cystitis 2006   Peptic ulcer disease 2012   PMB (postmenopausal bleeding) 2014   Proteinuria 1980    Past Surgical History:  Procedure Laterality Date   ABDOMINAL HYSTERECTOMY     BILATERAL SALPINGECTOMY Bilateral 07/07/2015   Procedure: BILATERAL SALPINGECTOMY;  Surgeon: Suzy Bouchard, MD;  Location: [facility] ORS;  Service: Gynecology;  Laterality: Bilateral;   CHOLECYSTECTOMY     TUBAL LIGATION     VAGINAL HYSTERECTOMY N/A 07/07/2015   Procedure: HYSTERECTOMY  VAGINAL;  Surgeon: Suzy Bouchard, MD;  Location: [facility] ORS;  Service: Gynecology;  Laterality: N/A;    Social History Social History   Tobacco Use   Smoking status: Former    Types: Cigarettes    Quit date: 06/30/2009    Years since quitting: 13.4   Smokeless tobacco: Never  Substance Use Topics   Alcohol use: No   Drug use: No    Family History Family History  Problem Relation Age of Onset   Breast cancer Paternal Grandmother        great gm   Breast cancer Cousin 50       mat cousin   Kidney disease Neg Hx    Kidney cancer Neg Hx    Prostate cancer Neg Hx     Allergies  Allergen Reactions   Penicillins Anaphylaxis and Rash   Etodolac Other (See Comments)    Mouth sores   Macrobid [Nitrofurantoin Monohyd Macro] Itching   Meloxicam Itching   Premarin [Conjugated Estrogens] Itching   Adhesive [Tape] Rash    Paper tape ok to use.   Biaxin [Clarithromycin] Rash     REVIEW OF SYSTEMS (Negative unless checked)  Constitutional: [] Weight loss  [] Fever  [] Chills Cardiac: [] Chest pain   [] Chest pressure   [] Palpitations   [] Shortness of breath when laying flat   [] Shortness of breath with exertion. Vascular:  [x] Pain in legs with walking   [] Pain in legs at rest  [] History of DVT   [] Phlebitis   [] Swelling in legs   []   Varicose veins   [] Non-healing ulcers Pulmonary:   [] Uses home oxygen   [] Productive cough   [] Hemoptysis   [] Wheeze  [] COPD   [] Asthma Neurologic:  [] Dizziness   [] Seizures   [] History of stroke   [] History of TIA  [] Aphasia   [] Vissual changes   [] Weakness or numbness in arm   [] Weakness or numbness in leg Musculoskeletal:   [] Joint swelling   [] Joint pain   [] Low back pain Hematologic:  [] Easy bruising  [] Easy bleeding   [] Hypercoagulable state   [] Anemic Gastrointestinal:  [] Diarrhea   [] Vomiting  [] Gastroesophageal reflux/heartburn   [] Difficulty swallowing. Genitourinary:  [] Chronic kidney disease   [] Difficult urination  [] Frequent urination    [] Blood in urine Skin:  [] Rashes   [] Ulcers  Psychological:  [] History of anxiety   []  History of major depression.  Physical Examination  There were no vitals filed for this visit. There is no height or weight on file to calculate BMI. Gen: WD/WN, NAD Head: [place]/AT, No temporalis wasting.  Ear/Nose/Throat: Hearing grossly intact, nares w/o erythema or drainage Eyes: PER, EOMI, sclera nonicteric.  Neck: Supple, no masses.  No bruit or JVD.  Pulmonary:  Good air movement, no audible wheezing, no use of accessory muscles.  Cardiac: RRR, normal S1, S2, no Murmurs. Vascular:  mild trophic changes, no open wounds Vessel Right Left  Radial Palpable Palpable  Gastrointestinal: soft, non-distended. No guarding/no peritoneal signs.  Musculoskeletal: M/S 5/5 throughout.  No visible deformity.  Neurologic: CN 2-12 intact. Pain and light touch intact in extremities.  Symmetrical.  Speech is fluent. Motor exam as listed above. Psychiatric: Judgment intact, Mood & affect appropriate for pt's clinical situation. Dermatologic: No rashes or ulcers noted.  No changes consistent with cellulitis.   CBC Lab Results  Component Value Date   WBC 10.5 07/07/2015   HGB 12.8 07/07/2015   HCT 38.2 07/07/2015   MCV 94.8 07/07/2015   PLT 200 07/07/2015    BMET    Component Value Date/Time   NA 136 07/08/2015 0712   NA 142 09/03/2011 1543   K 3.6 07/08/2015 0712   K 3.3 (L) 09/03/2011 1543   CL 100 (L) 07/08/2015 0712   CL 106 09/03/2011 1543   CO2 29 07/08/2015 0712   CO2 25 09/03/2011 1543   GLUCOSE 86 07/08/2015 0712   GLUCOSE 93 09/03/2011 1543   BUN 10 07/08/2015 0712   BUN 11 09/03/2011 1543   CREATININE 0.79 07/08/2015 0712   CREATININE 0.76 09/03/2011 1543   CALCIUM 8.7 (L) 07/08/2015 0712   CALCIUM 8.8 09/03/2011 1543   GFRNONAA >60 07/08/2015 0712   GFRNONAA >60 09/03/2011 1543   GFRAA >60 07/08/2015 0712   GFRAA >60 09/03/2011 1543   CrCl cannot be calculated (Patient's most  recent lab result is older than the maximum 21 days allowed.).  COAG No results found for: "INR", "PROTIME"  Radiology No results found.   Assessment/Plan 1. Celiac artery stenosis Recommend:  The patient has evidence of severe atherosclerotic changes of the celiac artery associated with abdominal pain and N/V.    Patient should undergo duplex ultrasound of the mesenteric arteries with the hope for planning intervention to eliminate the ischemic symptoms.     All questions were answered.  Patient agrees to proceed with angiography and intervention.  The patient will follow up with me after the angiogram. - VAS [name] MESENTERIC; Future  2. Chronic mesenteric ischemia See #1 - VAS [name] MESENTERIC; Future  3. Gastroesophageal reflux disease, unspecified whether esophagitis  present Continue PPI as already ordered, this medication has been reviewed and there are no changes at this time.  Avoidence of caffeine and alcohol  Moderate elevation of the head of the bed   4. Hypothyroidism, unspecified type Continue hormone replacement as ordered and reviewed, no changes at this time    Levora Dredge, MD  12/12/2022 12:59 PM

## 2022-12-13 ENCOUNTER — Encounter (INDEPENDENT_AMBULATORY_CARE_PROVIDER_SITE_OTHER): Payer: Self-pay | Admitting: Vascular Surgery

## 2022-12-13 ENCOUNTER — Ambulatory Visit (INDEPENDENT_AMBULATORY_CARE_PROVIDER_SITE_OTHER): Payer: BC Managed Care – PPO | Admitting: Vascular Surgery

## 2022-12-13 VITALS — BP 134/79 | HR 80 | Resp 18 | Ht 65.0 in | Wt 171.4 lb

## 2022-12-13 DIAGNOSIS — K219 Gastro-esophageal reflux disease without esophagitis: Secondary | ICD-10-CM

## 2022-12-13 DIAGNOSIS — I771 Stricture of artery: Secondary | ICD-10-CM | POA: Diagnosis not present

## 2022-12-13 DIAGNOSIS — E039 Hypothyroidism, unspecified: Secondary | ICD-10-CM | POA: Diagnosis not present

## 2022-12-13 DIAGNOSIS — K551 Chronic vascular disorders of intestine: Secondary | ICD-10-CM | POA: Diagnosis not present

## 2022-12-14 ENCOUNTER — Encounter (INDEPENDENT_AMBULATORY_CARE_PROVIDER_SITE_OTHER): Payer: Self-pay | Admitting: Vascular Surgery

## 2022-12-14 DIAGNOSIS — K219 Gastro-esophageal reflux disease without esophagitis: Secondary | ICD-10-CM | POA: Insufficient documentation

## 2022-12-14 DIAGNOSIS — I771 Stricture of artery: Secondary | ICD-10-CM | POA: Insufficient documentation

## 2022-12-14 DIAGNOSIS — E039 Hypothyroidism, unspecified: Secondary | ICD-10-CM | POA: Insufficient documentation

## 2023-01-04 ENCOUNTER — Ambulatory Visit (INDEPENDENT_AMBULATORY_CARE_PROVIDER_SITE_OTHER): Payer: BC Managed Care – PPO

## 2023-01-04 ENCOUNTER — Ambulatory Visit (INDEPENDENT_AMBULATORY_CARE_PROVIDER_SITE_OTHER): Payer: BC Managed Care – PPO | Admitting: Nurse Practitioner

## 2023-01-04 ENCOUNTER — Encounter (INDEPENDENT_AMBULATORY_CARE_PROVIDER_SITE_OTHER): Payer: Self-pay | Admitting: Nurse Practitioner

## 2023-01-04 VITALS — BP 139/77 | HR 70 | Resp 16 | Wt 173.6 lb

## 2023-01-04 DIAGNOSIS — M79604 Pain in right leg: Secondary | ICD-10-CM | POA: Diagnosis not present

## 2023-01-04 DIAGNOSIS — K551 Chronic vascular disorders of intestine: Secondary | ICD-10-CM

## 2023-01-04 DIAGNOSIS — M79605 Pain in left leg: Secondary | ICD-10-CM | POA: Diagnosis not present

## 2023-01-04 DIAGNOSIS — I771 Stricture of artery: Secondary | ICD-10-CM | POA: Diagnosis not present

## 2023-01-04 NOTE — Progress Notes (Signed)
Subjective:    Patient ID: Amy Hammond, female    DOB: 12-27-1960, 62 y.o.   MRN: [id number] Chief Complaint  Patient presents with   Follow-up    Ultrasound follow up    The patient returns today for evaluation of abdominal pain evident certain etiology.  The patient is followed the GI service, who is concerned about chronic mesenteric ischemia.  The patient has noted some weight loss as well as nausea.  The patient does substantiate food fear, particular foods do not seem to aggravate or alleviate the symptoms.  The patient denies bloody bowel movements or diarrhea.     No prior peripheral angiograms or vascular interventions.  She has had a heart catheterization before   The patient denies amaurosis fugax or recent TIA symptoms. There are no recent neurological changes noted. The patient denies history of DVT, PE or superficial thrombophlebitis. The patient denies recent episodes of angina   She also endorses having pain in her legs with ambulation but the right leg tends to be more affected.  She notes that there is an area of radiation in the groin with pain down her leg.  There is also an area that aches and throbs in the distal calf portion.  She does have some known lower back issues.   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  Today noninvasive studies show a 70 to 99% stenosis of the celiac artery and superior mesenteric artery.  It is noted that the celiac artery has an abnormal circular shape.  Given that the stenosis is at the region of the celiac artery, I suspect that this is consistent with the poststenotic dilatation noted in the CT scan in 2020    Review of Systems  Gastrointestinal:  Positive for abdominal pain and nausea.  Musculoskeletal:  Positive for arthralgias.  All other systems reviewed and are negative.       Objective:   Physical Exam Vitals reviewed.  HENT:     Head: Normocephalic.  Neck:     Vascular: No carotid bruit.  Cardiovascular:     Rate and Rhythm: Normal rate.  Pulmonary:     Effort: Pulmonary effort is normal.  Skin:    General: Skin is warm and dry.  Neurological:     Mental Status: She is alert and oriented to person, place, and time.  Psychiatric:        Mood and Affect: Mood normal.        Behavior: Behavior normal.        Thought Content: Thought content normal.        Judgment: Judgment normal.    BP 139/77 (BP Location: Left Arm)   Pulse 70   Resp 16   Wt 173 lb 9.6 oz (78.7 kg)   BMI 28.89 kg/m   Past Medical History:  Diagnosis Date   Amyopathic dermatomyositis (HCC) 2012   Arthritis 2012   osteo   Depression    GERD (gastroesophageal reflux disease)    Hyperlipemia    Interstitial cystitis 2006   Peptic ulcer disease 2012   PMB (postmenopausal bleeding) 2014   Proteinuria 1980    Social History   Socioeconomic History   Marital status: Divorced    Spouse name: Not on file   Number of children: Not on file   Years of education: Not on file  Highest education level: Not on file  Occupational History   Not on file  Tobacco Use   Smoking status: Former    Types: Cigarettes    Quit date: 06/30/2009    Years since quitting: 13.5   Smokeless tobacco: Never  Substance and Sexual Activity   Alcohol use: No   Drug use: No   Sexual activity: Not on file  Other Topics Concern   Not on file  Social History Narrative   Not on file   Social Determinants of Health   Financial Resource Strain: Not on file  Food Insecurity: Not on file  Transportation Needs: Not on file  Physical Activity: Not on file  Stress: Not on file  Social Connections: Not on file  Intimate Partner Violence: Not on file    Past Surgical History:  Procedure Laterality Date   ABDOMINAL HYSTERECTOMY     BILATERAL SALPINGECTOMY Bilateral 07/07/2015   Procedure:  BILATERAL SALPINGECTOMY;  Surgeon: Ihor Austin Schermerhorn, MD;  Location: [facility];  Service: Gynecology;  Laterality: Bilateral;   CHOLECYSTECTOMY     TUBAL LIGATION     VAGINAL HYSTERECTOMY N/A 07/07/2015   Procedure: HYSTERECTOMY VAGINAL;  Surgeon: Suzy Bouchard, MD;  Location: [facility];  Service: Gynecology;  Laterality: N/A;    Family History  Problem Relation Age of Onset   Breast cancer Paternal Grandmother        great gm   Breast cancer Cousin 50       mat cousin   Kidney disease Neg Hx    Kidney cancer Neg Hx    Prostate cancer Neg Hx     Allergies  Allergen Reactions   Penicillins Anaphylaxis and Rash   Etodolac Other (See Comments)    Mouth sores   Macrobid [Nitrofurantoin Monohyd Macro] Itching   Meloxicam Itching   Premarin [Conjugated Estrogens] Itching   Adhesive [Tape] Rash    Paper tape ok to use.   Biaxin [Clarithromycin] Rash       Latest Ref Rng & Units 07/07/2015    2:41 PM 07/01/2015   10:47 AM 09/03/2011    3:43 PM  CBC  WBC 3.6 - 11.0 K/uL 10.5  6.4  7.9   Hemoglobin 12.0 - 16.0 g/dL 62.1  30.8  65.7   Hematocrit 35.0 - 47.0 % 38.2  42.4  41.5   Platelets 150 - 440 K/uL 200  232  288       CMP     Component Value Date/Time   NA 136 07/08/2015 0712   NA 142 09/03/2011 1543   K 3.6 07/08/2015 0712   K 3.3 (L) 09/03/2011 1543   CL 100 (L) 07/08/2015 0712   CL 106 09/03/2011 1543   CO2 29 07/08/2015 0712   CO2 25 09/03/2011 1543   GLUCOSE 86 07/08/2015 0712   GLUCOSE 93 09/03/2011 1543   BUN 10 07/08/2015 0712   BUN 11 09/03/2011 1543   CREATININE 0.79 07/08/2015 0712   CREATININE 0.76 09/03/2011 1543   CALCIUM 8.7 (L) 07/08/2015 0712   CALCIUM 8.8 09/03/2011 1543   PROT 7.3 09/03/2011 1543   ALBUMIN 4.0 09/03/2011 1543   AST 38 (H) 09/03/2011 1543   ALT 49 09/03/2011 1543   ALKPHOS 137 (H) 09/03/2011 1543   BILITOT 0.5 09/03/2011 1543   GFRNONAA >60 07/08/2015 0712   GFRNONAA >60 09/03/2011 1543   GFRAA >60 07/08/2015  0712   GFRAA >60 09/03/2011 1543     No results found.  Assessment & Plan:   1. Celiac artery stenosis (HCC) Recommend:  The patient has evidence of severe atherosclerotic changes of the mesenteric arteries associated with weight loss as well as abdominal pain and N/V.  This represents a high risk for bowel infarction and death.  Patient should undergo angiography of the mesenteric arteries with the hope for intervention to eliminate the ischemic symptoms.    The risks and benefits as well as the alternative therapies was discussed in detail with the patient.  All questions were answered.  Patient agrees to proceed with angiography and intervention.  The patient will follow up with me after the angiogram.  2. Chronic mesenteric ischemia (HCC) See above  3. Pain in both lower extremities I suspect that the pain in the patient's lower extremities is related to her lower back but she has decreased palpable pulses and some of her symptoms are somewhat consistent with claudication.  There is also possible some of this could be related to venous disease.  When the patient follows her mesenteric we can have an ABI done as well as a venous reflux study on the right lower extremity to evaluate for possible vascular causes of discomfort.   Current Outpatient Medications on File Prior to Visit  Medication Sig Dispense Refill   buPROPion (WELLBUTRIN XL) 300 MG 24 hr tablet Take 300 mg by mouth daily.     CALCIUM CARBONATE-VIT D-MIN PO Take 1 tablet by mouth 2 (two) times daily.     conjugated estrogens (PREMARIN) vaginal cream Place 1 Applicatorful vaginally daily. Apply 0.5mg  (pea-sized amount)  just inside the vaginal introitus with a finger-tip every night for two weeks and then Monday, Wednesday and Friday nights. 30 g 12   Cyanocobalamin (VITAMIN B 12 PO) Take 1 tablet by mouth 2 (two) times daily.     fluticasone (FLONASE) 50 MCG/ACT nasal spray Place 2 sprays into both nostrils daily.      folic acid (FOLVITE) 1 MG tablet Take 1 mg by mouth daily.     gabapentin (NEURONTIN) 400 MG capsule Take 400 mg by mouth 3 (three) times daily.     latanoprost (XALATAN) 0.005 % ophthalmic solution INSTILL 1 DROP IN BOTH EYES AT BEDTIME     levothyroxine (SYNTHROID) 25 MCG tablet Take on an empty stomach with a glass of water at least 30-60 minutes before breakfast. Need to recheck Thyroid level before this RX runs out     LINZESS 290 MCG CAPS capsule Take 290 mcg by mouth daily.     magnesium oxide (MAG-OX) 400 MG tablet Take by mouth.     meclizine (ANTIVERT) 25 MG tablet Take by mouth.     Melatonin 1 MG TABS Take 1 tablet by mouth at bedtime.     methotrexate (RHEUMATREX) 10 MG tablet Take 10 mg by mouth once a week. Caution: Chemotherapy. Protect from light.     minoxidil (LONITEN) 2.5 MG tablet TAKE 1 TABLET(2.5 MG) BY MOUTH DAILY 90 tablet 1   naproxen (NAPROSYN) 500 MG tablet Take by mouth.     pantoprazole (PROTONIX) 40 MG tablet Take 40 mg by mouth 2 (two) times daily.     pimecrolimus (ELIDEL) 1 % cream Apply topically 2 (two) times daily. As needed 60 g 0   Potassium 95 MG TABS Take by mouth 2 (two) times daily.     sucralfate (CARAFATE) 1 GM/10ML suspension Take 1 g by mouth 2 (two) times daily. As needed.     tiZANidine (ZANAFLEX) 2 MG  tablet TAKE 1/2 TO 2 TABLETS BY MOUTH AT NIGHT AS NEEDED FOR BACK AND HIP PAIN     torsemide (DEMADEX) 20 MG tablet Take by mouth.     venlafaxine (EFFEXOR) 75 MG tablet Take 75 mg by mouth every morning.     docusate sodium (COLACE) 100 MG capsule Take 1 capsule (100 mg total) by mouth 2 (two) times daily. (Patient not taking: Reported on 12/13/2022) 60 capsule 0   ibuprofen (ADVIL,MOTRIN) 800 MG tablet Take 1 tablet (800 mg total) by mouth every 8 (eight) hours as needed. (Patient not taking: Reported on 12/03/2016) 30 tablet 0   oxyCODONE-acetaminophen (PERCOCET/ROXICET) 5-325 MG tablet Take 1-2 tablets by mouth every 4 (four) hours as needed  (moderate to severe pain (when tolerating fluids)). (Patient not taking: Reported on 12/03/2016) 30 tablet 0   No current facility-administered medications on file prior to visit.    There are no Patient Instructions on file for this visit. No follow-ups on file.   Georgiana Spinner, NP

## 2023-01-06 ENCOUNTER — Ambulatory Visit: Payer: BC Managed Care – PPO | Admitting: Dermatology

## 2023-01-06 VITALS — BP 148/75 | HR 82

## 2023-01-06 DIAGNOSIS — L649 Androgenic alopecia, unspecified: Secondary | ICD-10-CM

## 2023-01-06 DIAGNOSIS — L820 Inflamed seborrheic keratosis: Secondary | ICD-10-CM

## 2023-01-06 DIAGNOSIS — X32XXXA Exposure to sunlight, initial encounter: Secondary | ICD-10-CM

## 2023-01-06 DIAGNOSIS — L578 Other skin changes due to chronic exposure to nonionizing radiation: Secondary | ICD-10-CM | POA: Diagnosis not present

## 2023-01-06 DIAGNOSIS — W908XXA Exposure to other nonionizing radiation, initial encounter: Secondary | ICD-10-CM

## 2023-01-06 DIAGNOSIS — L821 Other seborrheic keratosis: Secondary | ICD-10-CM

## 2023-01-06 DIAGNOSIS — Z79899 Other long term (current) drug therapy: Secondary | ICD-10-CM

## 2023-01-06 MED ORDER — MINOXIDIL 2.5 MG PO TABS: ORAL_TABLET | ORAL | 2 refills | Status: DC

## 2023-01-06 NOTE — Progress Notes (Signed)
Follow-Up Visit   Subjective  Amy Hammond is a 62 y.o. female who presents for the following: androgenic alopecia - patient currently using Minoxidil 2.5 mg po QD with no s/e. She has noticed areas of regrowth. The patient has spots, moles and lesions to be evaluated, some may be new or changing and the patient may have concern these could be cancer.  The following portions of the chart were reviewed this encounter and updated as appropriate: medications, allergies, medical history  Review of Systems:  No other skin or systemic complaints except as noted in HPI or Assessment and Plan.  Objective  Well appearing patient in no apparent distress; mood and affect are within normal limits. A focused examination was performed of the following areas: the face and scalp Relevant exam findings are noted in the Assessment and Plan.  R mastoid x 1 Erythematous stuck-on, waxy papule or plaque   Assessment & Plan              ANDROGENETIC ALOPECIA (FEMALE PATTERN HAIR LOSS) Exam: Diffuse thinning of the crown and widening of the midline part with retention of the frontal hairline  Chronic and persistent condition with duration or expected duration over one year. Condition is symptomatic/ bothersome to patient. Improved, with regrowth.  Not to goal not to goal but patient is happy Female Androgenic Alopecia is a chronic condition related to genetics and/or hormonal changes.   She is on systemic treatment with oral minoxidil and has having no side effects.  She would like continue  In women androgenetic alopecia is commonly associated with menopause but may occur any time after puberty.  It causes hair thinning primarily on the crown with widening of the part and temporal hairline recession.  Can use OTC Rogaine (minoxidil) 5% solution/foam as directed.  Oral treatments in female patients who have no contraindication may include : - Low dose oral minoxidil 1.25 - 5mg  daily -  Spironolactone 50 - 100mg  bid - Finasteride 2.5 - 5 mg daily Adjunctive therapies include: - Low Level Laser Light Therapy (LLLT) - Platelet-rich plasma injections (PRP) - Hair Transplants or scalp reduction   Treatment Plan: Continue Minoxidil 2.5 mg po QD.   SEBORRHEIC KERATOSIS - Stuck-on, waxy, tan-brown papules and/or plaques  - Benign-appearing - Discussed benign etiology and prognosis. - Observe - Call for any changes  ACTINIC DAMAGE - chronic, secondary to cumulative UV radiation exposure/sun exposure over time - diffuse scaly erythematous macules with underlying dyspigmentation - Recommend daily broad spectrum sunscreen SPF 30+ to sun-exposed areas, reapply every 2 hours as needed.  - Recommend staying in the shade or wearing long sleeves, sun glasses (UVA+UVB protection) and wide brim hats (4-inch brim around the entire circumference of the hat). - Call for new or changing lesions.  Inflamed seborrheic keratosis R mastoid x 1  Destruction of lesion - R mastoid x 1 Complexity: simple   Destruction method: cryotherapy   Informed consent: discussed and consent obtained   Timeout:  patient name, date of birth, surgical site, and procedure verified Lesion destroyed using liquid nitrogen: Yes   Region frozen until ice ball extended beyond lesion: Yes   Outcome: patient tolerated procedure well with no complications   Post-procedure details: wound care instructions given    Androgenetic alopecia  Related Medications minoxidil (LONITEN) 2.5 MG tablet Take one tab po QD.  Long-term use of high-risk medication  Medication management  Seborrheic keratosis  Actinic skin damage   Return in about 6 months (  around 07/09/2023) for androgenic alopecia and rosacea.  Amy Hammond, CMA, am acting as scribe for Amy Sans, MD .  Documentation: I have reviewed the above documentation for accuracy and completeness, and I agree with the above.  Amy Sans,  MD

## 2023-01-06 NOTE — Patient Instructions (Signed)
Due to recent changes in healthcare laws, you may see results of your pathology and/or laboratory studies on MyChart before the doctors have had a chance to review them. We understand that in some cases there may be results that are confusing or concerning to you. Please understand that not all results are received at the same time and often the doctors may need to interpret multiple results in order to provide you with the best plan of care or course of treatment. Therefore, we ask that you please give us 2 business days to thoroughly review all your results before contacting the office for clarification. Should we see a critical lab result, you will be contacted sooner.   If You Need Anything After Your Visit  If you have any questions or concerns for your doctor, please call our main line at [phone] and press option 4 to reach your doctor's medical assistant. If no one answers, please leave a voicemail as directed and we will return your call as soon as possible. Messages left after 4 pm will be answered the following business day.   You may also send us a message via MyChart. We typically respond to MyChart messages within 1-2 business days.  For prescription refills, please ask your pharmacy to contact our office. Our fax number is [phone].  If you have an urgent issue when the clinic is closed that cannot wait until the next business day, you can page your doctor at the number below.    Please note that while we do our best to be available for urgent issues outside of office hours, we are not available 24/7.   If you have an urgent issue and are unable to reach us, you may choose to seek medical care at your doctor's office, retail clinic, urgent care center, or emergency room.  If you have a medical emergency, please immediately call 911 or go to the emergency department.  Pager Numbers  - Dr. Kowalski: [phone]  - Dr. Moye: [phone]  - Dr. Stewart:  [phone]  In the event of inclement weather, please call our main line at [phone] for an update on the status of any delays or closures.  Dermatology Medication Tips: Please keep the boxes that topical medications come in in order to help keep track of the instructions about where and how to use these. Pharmacies typically print the medication instructions only on the boxes and not directly on the medication tubes.   If your medication is too expensive, please contact our office at [phone] option 4 or send us a message through MyChart.   We are unable to tell what your co-pay for medications will be in advance as this is different depending on your insurance coverage. However, we may be able to find a substitute medication at lower cost or fill out paperwork to get insurance to cover a needed medication.   If a prior authorization is required to get your medication covered by your insurance company, please allow us 1-2 business days to complete this process.  Drug prices often vary depending on where the prescription is filled and some pharmacies may offer cheaper prices.  The website www.goodrx.com contains coupons for medications through different pharmacies. The prices here do not account for what the cost may be with help from insurance (it may be cheaper with your insurance), but the website can give you the price if you did not use any insurance.  - You can print the associated coupon and take it with   your prescription to the pharmacy.  - You may also stop by our office during regular business hours and pick up a GoodRx coupon card.  - If you need your prescription sent electronically to a different pharmacy, notify our office through [facility] MyChart or by phone at [phone] option 4.     Si Usted Necesita Algo Despus de Su Visita  Tambin puede enviarnos un mensaje a travs de MyChart. Por lo general respondemos a los mensajes de MyChart en el transcurso de 1 a 2  das hbiles.  Para renovar recetas, por favor pida a su farmacia que se ponga en contacto con nuestra oficina. Nuestro nmero de fax es el [phone].  Si tiene un asunto urgente cuando la clnica est cerrada y que no puede esperar hasta el siguiente da hbil, puede llamar/localizar a su doctor(a) al nmero que aparece a continuacin.   Por favor, tenga en cuenta que aunque hacemos todo lo posible para estar disponibles para asuntos urgentes fuera del horario de oficina, no estamos disponibles las 24 horas del da, los 7 das de la semana.   Si tiene un problema urgente y no puede comunicarse con nosotros, puede optar por buscar atencin mdica  en el consultorio de su doctor(a), en una clnica privada, en un centro de atencin urgente o en una sala de emergencias.  Si tiene una emergencia mdica, por favor llame inmediatamente al 911 o vaya a la sala de emergencias.  Nmeros de bper  - Dr. Kowalski: [phone]  - Dra. Moye: [phone]  - Dra. Stewart: [phone]  En caso de inclemencias del tiempo, por favor llame a nuestra lnea principal al [phone] para una actualizacin sobre el estado de cualquier retraso o cierre.  Consejos para la medicacin en dermatologa: Por favor, guarde las cajas en las que vienen los medicamentos de uso tpico para ayudarle a seguir las instrucciones sobre dnde y cmo usarlos. Las farmacias generalmente imprimen las instrucciones del medicamento slo en las cajas y no directamente en los tubos del medicamento.   Si su medicamento es muy caro, por favor, pngase en contacto con nuestra oficina llamando al [phone] y presione la opcin 4 o envenos un mensaje a travs de MyChart.   No podemos decirle cul ser su copago por los medicamentos por adelantado ya que esto es diferente dependiendo de la cobertura de su seguro. Sin embargo, es posible que podamos encontrar un medicamento sustituto a menor costo o llenar un formulario para que el  seguro cubra el medicamento que se considera necesario.   Si se requiere una autorizacin previa para que su compaa de seguros cubra su medicamento, por favor permtanos de 1 a 2 das hbiles para completar este proceso.  Los precios de los medicamentos varan con frecuencia dependiendo del lugar de dnde se surte la receta y alguna farmacias pueden ofrecer precios ms baratos.  El sitio web www.goodrx.com tiene cupones para medicamentos de diferentes farmacias. Los precios aqu no tienen en cuenta lo que podra costar con la ayuda del seguro (puede ser ms barato con su seguro), pero el sitio web puede darle el precio si no utiliz ningn seguro.  - Puede imprimir el cupn correspondiente y llevarlo con su receta a la farmacia.  - Tambin puede pasar por nuestra oficina durante el horario de atencin regular y recoger una tarjeta de cupones de GoodRx.  - Si necesita que su receta se enve electrnicamente a una farmacia diferente, informe a nuestra oficina a travs de MyChart de [facility]   o por telfono llamando al [phone] y presione la opcin 4.  

## 2023-01-15 ENCOUNTER — Encounter: Payer: Self-pay | Admitting: Dermatology

## 2023-01-18 ENCOUNTER — Telehealth (INDEPENDENT_AMBULATORY_CARE_PROVIDER_SITE_OTHER): Payer: Self-pay

## 2023-01-18 NOTE — Telephone Encounter (Signed)
Spoke with the patient and she is scheduled with Dr. Gilda Crease on 02/08/23 with a 11:00 am arrival time to the [facility] for a mesenteric angio. Pre-procedure instructions were discussed and will be sent to Mychart and mailed.

## 2023-02-08 ENCOUNTER — Ambulatory Visit
Admission: RE | Admit: 2023-02-08 | Discharge: 2023-02-08 | Disposition: A | Discharge status: Home / Self Care | Payer: BC Managed Care – PPO | Attending: Vascular Surgery | Admitting: Vascular Surgery

## 2023-02-08 ENCOUNTER — Encounter
Admission: RE | Disposition: A | Discharge status: Home / Self Care | Payer: Self-pay | Source: Home / Self Care | Attending: Vascular Surgery

## 2023-02-08 ENCOUNTER — Encounter: Payer: Self-pay | Admitting: Vascular Surgery

## 2023-02-08 ENCOUNTER — Other Ambulatory Visit: Payer: Self-pay

## 2023-02-08 DIAGNOSIS — E8721 Acute metabolic acidosis: Secondary | ICD-10-CM

## 2023-02-08 DIAGNOSIS — E872 Acidosis, unspecified: Secondary | ICD-10-CM | POA: Insufficient documentation

## 2023-02-08 DIAGNOSIS — I771 Stricture of artery: Secondary | ICD-10-CM | POA: Insufficient documentation

## 2023-02-08 DIAGNOSIS — I708 Atherosclerosis of other arteries: Secondary | ICD-10-CM

## 2023-02-08 DIAGNOSIS — K551 Chronic vascular disorders of intestine: Secondary | ICD-10-CM | POA: Diagnosis present

## 2023-02-08 DIAGNOSIS — M79605 Pain in left leg: Secondary | ICD-10-CM | POA: Diagnosis not present

## 2023-02-08 DIAGNOSIS — M79604 Pain in right leg: Secondary | ICD-10-CM | POA: Insufficient documentation

## 2023-02-08 HISTORY — PX: VISCERAL ANGIOGRAPHY: CATH118276

## 2023-02-08 LAB — CREATININE, SERUM
Creatinine, Ser: 1.06 mg/dL — ABNORMAL HIGH (ref 0.44–1.00)
GFR, Estimated: 59 mL/min — ABNORMAL LOW (ref >60)

## 2023-02-08 LAB — BUN: BUN: 14 mg/dL (ref 8–23)

## 2023-02-08 SURGERY — VISCERAL ANGIOGRAPHY
Anesthesia: Moderate Sedation

## 2023-02-08 MED ORDER — FAMOTIDINE 20 MG PO TABS: 40.0000 mg | ORAL_TABLET | Freq: Once | ORAL | Status: DC | PRN

## 2023-02-08 MED ORDER — MORPHINE SULFATE (PF) 4 MG/ML IV SOLN: 2.0000 mg | INTRAVENOUS | Status: DC | PRN

## 2023-02-08 MED ORDER — DIPHENHYDRAMINE HCL 50 MG/ML IJ SOLN
INTRAMUSCULAR | Status: AC
  Filled 2023-02-08: qty 1

## 2023-02-08 MED ORDER — SODIUM CHLORIDE 0.9% FLUSH: 3.0000 mL | INTRAVENOUS | Status: DC | PRN

## 2023-02-08 MED ORDER — MIDAZOLAM HCL 2 MG/2ML IJ SOLN
INTRAMUSCULAR | Status: DC | PRN
  Administered 2023-02-08: 1 mg via INTRAVENOUS
  Administered 2023-02-08 (×2): .5 mg via INTRAVENOUS
  Administered 2023-02-08 (×2): 1 mg via INTRAVENOUS

## 2023-02-08 MED ORDER — FENTANYL CITRATE (PF) 100 MCG/2ML IJ SOLN
INTRAMUSCULAR | Status: AC
  Filled 2023-02-08: qty 2

## 2023-02-08 MED ORDER — CLOPIDOGREL BISULFATE 300 MG PO TABS
ORAL_TABLET | ORAL | Status: AC
  Filled 2023-02-08: qty 1

## 2023-02-08 MED ORDER — CLOPIDOGREL BISULFATE 75 MG PO TABS: 75.0000 mg | ORAL_TABLET | Freq: Every day | ORAL | 5 refills | Status: DC

## 2023-02-08 MED ORDER — OXYCODONE HCL 5 MG PO TABS: 5.0000 mg | ORAL_TABLET | ORAL | Status: DC | PRN

## 2023-02-08 MED ORDER — ASPIRIN 81 MG PO TBEC
162.0000 mg | DELAYED_RELEASE_TABLET | Freq: Every day | ORAL | Status: DC
  Administered 2023-02-08: 162 mg via ORAL

## 2023-02-08 MED ORDER — SODIUM CHLORIDE 0.9 % IV SOLN: INTRAVENOUS | Status: DC

## 2023-02-08 MED ORDER — VANCOMYCIN HCL IN DEXTROSE 1-5 GM/200ML-% IV SOLN
1000.0000 mg | INTRAVENOUS | Status: AC
  Administered 2023-02-08: 1000 mg via INTRAVENOUS

## 2023-02-08 MED ORDER — HYDROMORPHONE HCL 1 MG/ML IJ SOLN
1.0000 mg | Freq: Once | INTRAMUSCULAR | Status: AC | PRN
  Administered 2023-02-08: 1 mg via INTRAVENOUS

## 2023-02-08 MED ORDER — VANCOMYCIN HCL IN DEXTROSE 1-5 GM/200ML-% IV SOLN
INTRAVENOUS | Status: AC
  Filled 2023-02-08: qty 200

## 2023-02-08 MED ORDER — HYDRALAZINE HCL 20 MG/ML IJ SOLN: 5.0000 mg | INTRAMUSCULAR | Status: DC | PRN

## 2023-02-08 MED ORDER — METHYLPREDNISOLONE SODIUM SUCC 125 MG IJ SOLR: 125.0000 mg | Freq: Once | INTRAMUSCULAR | Status: DC | PRN

## 2023-02-08 MED ORDER — HYDROMORPHONE HCL 1 MG/ML IJ SOLN
INTRAMUSCULAR | Status: AC
  Filled 2023-02-08: qty 1

## 2023-02-08 MED ORDER — MIDAZOLAM HCL 2 MG/2ML IJ SOLN
INTRAMUSCULAR | Status: AC
  Filled 2023-02-08: qty 2

## 2023-02-08 MED ORDER — DIPHENHYDRAMINE HCL 50 MG/ML IJ SOLN
INTRAMUSCULAR | Status: DC | PRN
  Administered 2023-02-08: 25 mg via INTRAVENOUS

## 2023-02-08 MED ORDER — ATORVASTATIN CALCIUM 10 MG PO TABS: 10.0000 mg | ORAL_TABLET | Freq: Every day | ORAL | 2 refills | Status: AC

## 2023-02-08 MED ORDER — IODIXANOL 320 MG/ML IV SOLN
INTRAVENOUS | Status: DC | PRN
  Administered 2023-02-08: 110 mL via INTRA_ARTERIAL

## 2023-02-08 MED ORDER — LABETALOL HCL 5 MG/ML IV SOLN: 10.0000 mg | INTRAVENOUS | Status: DC | PRN

## 2023-02-08 MED ORDER — DIPHENHYDRAMINE HCL 50 MG/ML IJ SOLN: 50.0000 mg | Freq: Once | INTRAMUSCULAR | Status: DC | PRN

## 2023-02-08 MED ORDER — CLOPIDOGREL BISULFATE 300 MG PO TABS
300.0000 mg | ORAL_TABLET | ORAL | Status: AC
  Administered 2023-02-08: 300 mg via ORAL

## 2023-02-08 MED ORDER — ASPIRIN 81 MG PO TBEC
DELAYED_RELEASE_TABLET | ORAL | Status: AC
  Filled 2023-02-08: qty 2

## 2023-02-08 MED ORDER — SODIUM CHLORIDE 0.9 % IV SOLN: 250.0000 mL | INTRAVENOUS | Status: DC | PRN

## 2023-02-08 MED ORDER — ONDANSETRON HCL 4 MG/2ML IJ SOLN: 4.0000 mg | Freq: Four times a day (QID) | INTRAMUSCULAR | Status: DC | PRN

## 2023-02-08 MED ORDER — MIDAZOLAM HCL 2 MG/ML PO SYRP: 8.0000 mg | ORAL_SOLUTION | Freq: Once | ORAL | Status: DC | PRN

## 2023-02-08 MED ORDER — ACETAMINOPHEN 325 MG PO TABS: 650.0000 mg | ORAL_TABLET | ORAL | Status: DC | PRN

## 2023-02-08 MED ORDER — SODIUM CHLORIDE 0.9% FLUSH: 3.0000 mL | Freq: Two times a day (BID) | INTRAVENOUS | Status: DC

## 2023-02-08 MED ORDER — CLOPIDOGREL BISULFATE 75 MG PO TABS
ORAL_TABLET | ORAL | Status: AC
  Filled 2023-02-08: qty 2

## 2023-02-08 MED ORDER — HEPARIN SODIUM (PORCINE) 1000 UNIT/ML IJ SOLN
INTRAMUSCULAR | Status: DC | PRN
  Administered 2023-02-08: 6000 [IU] via INTRAVENOUS

## 2023-02-08 MED ORDER — HEPARIN SODIUM (PORCINE) 1000 UNIT/ML IJ SOLN
INTRAMUSCULAR | Status: AC
  Filled 2023-02-08: qty 10

## 2023-02-08 MED ORDER — ASPIRIN 81 MG PO TBEC: 81.0000 mg | DELAYED_RELEASE_TABLET | Freq: Every day | ORAL | 1 refills | Status: AC

## 2023-02-08 MED ORDER — FENTANYL CITRATE (PF) 100 MCG/2ML IJ SOLN
INTRAMUSCULAR | Status: DC | PRN
  Administered 2023-02-08: 25 ug via INTRAVENOUS
  Administered 2023-02-08: 50 ug via INTRAVENOUS
  Administered 2023-02-08: 25 ug via INTRAVENOUS

## 2023-02-08 SURGICAL SUPPLY — 21 items
BALLN LUTONIX AV 8X40X75 (BALLOONS) ×1
BALLOON LUTONIX AV 8X40X75 (BALLOONS) IMPLANT
CATH ANGIO 5F PIGTAIL 65CM (CATHETERS) IMPLANT
CATH BEACON 5 .035 65 KMP TIP (CATHETERS) IMPLANT
CATH VS1 5F ANGIO SLIP (CATHETERS) IMPLANT
COVER PROBE ULTRASOUND 5X96 (MISCELLANEOUS) IMPLANT
DEVICE STARCLOSE SE CLOSURE (Vascular Products) IMPLANT
DEVICE TORQUE (MISCELLANEOUS) IMPLANT
GLIDEWIRE ANGLED SS 035X260CM (WIRE) IMPLANT
KIT ENCORE 26 ADVANTAGE (KITS) IMPLANT
NDL ENTRY 21GA 7CM ECHOTIP (NEEDLE) IMPLANT
NEEDLE ENTRY 21GA 7CM ECHOTIP (NEEDLE) ×1 IMPLANT
PACK ANGIOGRAPHY (CUSTOM PROCEDURE TRAY) ×1 IMPLANT
SET INTRO CAPELLA COAXIAL (SET/KITS/TRAYS/PACK) IMPLANT
SHEATH ANL2 6FRX45 HC (SHEATH) IMPLANT
SHEATH BRITE TIP 5FRX11 (SHEATH) IMPLANT
STENT LIFESTREAM 7X16X80 (Permanent Stent) IMPLANT
SYR MEDRAD MARK 7 150ML (SYRINGE) IMPLANT
TUBING CONTRAST HIGH PRESS 72 (TUBING) IMPLANT
WIRE AMPLATZ SSTIFF .035X260CM (WIRE) IMPLANT
WIRE GUIDERIGHT .035X150 (WIRE) IMPLANT

## 2023-02-08 NOTE — H&P (View-Only) (Signed)
MRN : [id number]  Amy Hammond is a 62 y.o. ([birth year]) female who presents with chief complaint of check circulation.  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   The patient has noted some weight loss as well as nausea.  The patient does substantiate food fear, particular foods do not seem to aggravate or alleviate the symptoms.  The patient denies bloody bowel movements or diarrhea.     No prior peripheral angiograms or vascular interventions.  She has had a heart catheterization before   The patient denies amaurosis fugax or recent TIA symptoms. There are no recent neurological changes noted. The patient denies history of DVT, PE or superficial thrombophlebitis. The patient denies recent episodes of angina    She also endorses having pain in her legs with ambulation but the right leg tends to be more affected.  She notes that there is an area of radiation in the groin with pain down her leg.  There is also an area that aches and throbs in the distal calf portion.  She does have some known lower back issues.   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   Today noninvasive studies show a 70 to 99% stenosis of the celiac artery and superior mesenteric artery.  It is noted that the celiac artery has an abnormal circular shape.  Given that the stenosis is at the region of the celiac artery, I suspect that this is consistent with the poststenotic dilatation noted in the CT scan in 2020   Current Meds  Medication Sig   buPROPion (WELLBUTRIN XL) 300 MG 24 hr tablet Take 300 mg by mouth daily.   CALCIUM CARBONATE-VIT D-MIN PO Take 1 tablet by mouth 2  (two) times daily.   Cyanocobalamin (VITAMIN B 12 PO) Take 1 tablet by mouth 2 (two) times daily.   fluticasone (FLONASE) 50 MCG/ACT nasal spray Place 2 sprays into both nostrils daily.   folic acid (FOLVITE) 1 MG tablet Take 1 mg by mouth daily.   gabapentin (NEURONTIN) 400 MG capsule Take 400 mg by mouth 3 (three) times daily.   latanoprost (XALATAN) 0.005 % ophthalmic solution INSTILL 1 DROP IN BOTH EYES AT BEDTIME   levothyroxine (SYNTHROID) 25 MCG tablet Take on an empty stomach with a glass of water at least 30-60 minutes before breakfast. Need to recheck Thyroid level before this RX runs out   LINZESS 290 MCG CAPS capsule Take 290 mcg by mouth daily.   magnesium oxide (MAG-OX) 400 MG tablet Take by mouth.   meclizine (ANTIVERT) 25 MG tablet Take by mouth.   methotrexate (RHEUMATREX) 10 MG tablet Take 10 mg by mouth once a week. Caution: Chemotherapy. Protect from light.   minoxidil (LONITEN) 2.5 MG tablet Take one tab po QD.   naproxen (NAPROSYN) 500 MG tablet Take by mouth.   pantoprazole (PROTONIX) 40 MG tablet  Take 40 mg by mouth 2 (two) times daily.   pimecrolimus (ELIDEL) 1 % cream Apply topically 2 (two) times daily. As needed   Potassium 95 MG TABS Take by mouth 2 (two) times daily.   tiZANidine (ZANAFLEX) 2 MG tablet TAKE 1/2 TO 2 TABLETS BY MOUTH AT NIGHT AS NEEDED FOR BACK AND HIP PAIN   torsemide (DEMADEX) 20 MG tablet Take by mouth.   venlafaxine (EFFEXOR) 75 MG tablet Take 75 mg by mouth every morning.    Past Medical History:  Diagnosis Date   Amyopathic dermatomyositis (HCC) 2012   Arthritis 2012   osteo   Depression    GERD (gastroesophageal reflux disease)    Hyperlipemia    Interstitial cystitis 2006   Peptic ulcer disease 2012   PMB (postmenopausal bleeding) 2014   Proteinuria 1980    Past Surgical History:  Procedure Laterality Date   ABDOMINAL HYSTERECTOMY     BILATERAL SALPINGECTOMY Bilateral 07/07/2015   Procedure: BILATERAL SALPINGECTOMY;  Surgeon:  Suzy Bouchard, MD;  Location: [facility] ORS;  Service: Gynecology;  Laterality: Bilateral;   CHOLECYSTECTOMY     TUBAL LIGATION     VAGINAL HYSTERECTOMY N/A 07/07/2015   Procedure: HYSTERECTOMY VAGINAL;  Surgeon: Suzy Bouchard, MD;  Location: [facility];  Service: Gynecology;  Laterality: N/A;    Social History Social History   Tobacco Use   Smoking status: Former    Types: Cigarettes    Quit date: 06/30/2009    Years since quitting: 13.6   Smokeless tobacco: Never  Substance Use Topics   Alcohol use: No   Drug use: No    Family History Family History  Problem Relation Age of Onset   Breast cancer Paternal Grandmother        great gm   Breast cancer Cousin 50       mat cousin   Kidney disease Neg Hx    Kidney cancer Neg Hx    Prostate cancer Neg Hx     Allergies  Allergen Reactions   Penicillins Anaphylaxis and Rash   Etodolac Other (See Comments)    Mouth sores   Macrobid [Nitrofurantoin Monohyd Macro] Itching   Meloxicam Itching   Premarin [Conjugated Estrogens] Itching   Adhesive [Tape] Rash    Paper tape ok to use.   Biaxin [Clarithromycin] Rash     REVIEW OF SYSTEMS (Negative unless checked)  Constitutional: [] Weight loss  [] Fever  [] Chills Cardiac: [] Chest pain   [] Chest pressure   [] Palpitations   [] Shortness of breath when laying flat   [] Shortness of breath with exertion. Vascular:  [x] Pain in legs with walking   [] Pain in legs at rest  [] History of DVT   [] Phlebitis   [] Swelling in legs   [] Varicose veins   [] Non-healing ulcers Pulmonary:   [] Uses home oxygen   [] Productive cough   [] Hemoptysis   [] Wheeze  [] COPD   [] Asthma Neurologic:  [] Dizziness   [] Seizures   [] History of stroke   [] History of TIA  [] Aphasia   [] Vissual changes   [] Weakness or numbness in arm   [] Weakness or numbness in leg Musculoskeletal:   [] Joint swelling   [] Joint pain   [] Low back pain Hematologic:  [] Easy bruising  [] Easy bleeding   [] Hypercoagulable state    [] Anemic Gastrointestinal:  [] Diarrhea   [] Vomiting  [] Gastroesophageal reflux/heartburn   [] Difficulty swallowing. Genitourinary:  [] Chronic kidney disease   [] Difficult urination  [] Frequent urination   [] Blood in urine Skin:  [] Rashes   [] Ulcers  Psychological:  []   History of anxiety   []  History of major depression.  Physical Examination  Vitals:   02/08/23 1109 02/08/23 1129  BP: (!) 152/77 (!) 142/69  Pulse: 77 75  Resp: 10 16  Temp: 98.2 F (36.8 C) 98.2 F (36.8 C)  TempSrc: Oral Oral  SpO2: 99% 97%  Weight: 79.4 kg 79.4 kg  Height: 5\' 4"  (1.626 m) 5\' 5"  (1.651 m)   Body mass index is 29.12 kg/m. Gen: WD/WN, NAD Head: [place]/AT, No temporalis wasting.  Ear/Nose/Throat: Hearing grossly intact, nares w/o erythema or drainage Eyes: PER, EOMI, sclera nonicteric.  Neck: Supple, no masses.  No bruit or JVD.  Pulmonary:  Good air movement, no audible wheezing, no use of accessory muscles.  Cardiac: RRR, normal S1, S2, no Murmurs. Vascular:  mild trophic changes, no open wounds Vessel Right Left  Radial Palpable Palpable  Gastrointestinal: soft, non-distended. No guarding/no peritoneal signs.  Musculoskeletal: M/S 5/5 throughout.  No visible deformity.  Neurologic: CN 2-12 intact. Pain and light touch intact in extremities.  Symmetrical.  Speech is fluent. Motor exam as listed above. Psychiatric: Judgment intact, Mood & affect appropriate for pt's clinical situation. Dermatologic: No rashes or ulcers noted.  No changes consistent with cellulitis.   CBC Lab Results  Component Value Date   WBC 10.5 07/07/2015   HGB 12.8 07/07/2015   HCT 38.2 07/07/2015   MCV 94.8 07/07/2015   PLT 200 07/07/2015    BMET    Component Value Date/Time   NA 136 07/08/2015 0712   NA 142 09/03/2011 1543   K 3.6 07/08/2015 0712   K 3.3 (L) 09/03/2011 1543   CL 100 (L) 07/08/2015 0712   CL 106 09/03/2011 1543   CO2 29 07/08/2015 0712   CO2 25 09/03/2011 1543   GLUCOSE 86 07/08/2015 0712    GLUCOSE 93 09/03/2011 1543   BUN 14 02/08/2023 1122   BUN 11 09/03/2011 1543   CREATININE 1.06 (H) 02/08/2023 1122   CREATININE 0.76 09/03/2011 1543   CALCIUM 8.7 (L) 07/08/2015 0712   CALCIUM 8.8 09/03/2011 1543   GFRNONAA 59 (L) 02/08/2023 1122   GFRNONAA >60 09/03/2011 1543   GFRAA >60 07/08/2015 0712   GFRAA >60 09/03/2011 1543   Estimated Creatinine Clearance: 56.6 mL/min (A) (by C-G formula based on SCr of 1.06 mg/dL (H)).  COAG No results found for: "INR", "PROTIME"  Radiology No results found.   Assessment/Plan 1. Celiac artery stenosis (HCC) Recommend:   The patient has evidence of severe atherosclerotic changes of the mesenteric arteries associated with weight loss as well as abdominal pain and N/V.  This represents a high risk for bowel infarction and death.   Patient should undergo angiography of the mesenteric arteries with the hope for intervention to eliminate the ischemic symptoms.     The risks and benefits as well as the alternative therapies was discussed in detail with the patient.  All questions were answered.  Patient agrees to proceed with angiography and intervention.   The patient will follow up with me after the angiogram.   2. Chronic mesenteric ischemia (HCC) See above   3. Pain in both lower extremities I suspect that the pain in the patient's lower extremities is related to her lower back but she has decreased palpable pulses and some of her symptoms are somewhat consistent with claudication.  There is also possible some of this could be related to venous disease.  When the patient follows her mesenteric we can have an ABI done as  well as a venous reflux study on the right lower extremity to evaluate for possible vascular causes of discomfort.   Levora Dredge, MD  02/08/2023 12:34 PM

## 2023-02-08 NOTE — Progress Notes (Addendum)
Amy Hammond had right, lower back/side pain that has decreased to a 5/10 from a 10/10 after a dose of IV dilaudid. No swelling noted on back or right groin site. Vital signs are stable, but O2 did drop to 88% on RA. Now at 94% on 2L. Daughter present at bedside, provider notified.

## 2023-02-08 NOTE — Op Note (Signed)
[facility]  Percutaneous Study/Intervention Procedural Note   Date of Surgery: 02/08/2023  Surgeon: Levora Dredge  Pre-operative Diagnosis: Ischemic colitis; recurrent abdominal pain associated with metabolic acidosis  Post-operative diagnosis:  Same  Procedure(s) Performed:  1.  Abdominal aortogram in the lateral views  2.  Selective injection of the celiac artery second-order catheter placement  3.  Percutaneous transluminal angioplasty and stent placement of the celiac artery             4.  Selective injection of the SMA second-order catheter placement.               5.  Closure right femoral artery with a [employer]    Anesthesia: Conscious sedation was administered under my direct supervision by the interventional radiology RN. IV Versed plus fentanyl were utilized. Continuous ECG, pulse oximetry and blood pressure was monitored throughout the entire procedure.  Conscious sedation was for a total of 51 minutes.  Sheath: 6 [place] Ansell right common femoral retrograde  Contrast: 110 cc  Fluoroscopy Time: 10.5 minutes  Indications:  [employer] presented with postprandial abdominal pain and weight loss. CT scan demonstrated high-grade stenosis of the origin of the celiac artery.  The patient has chronic mesenteric ischemia. The risks and benefits of angiography with possible intervention have been reviewed, all questions were answered and the patient has agreed to proceed with angiography and possible intervention.  Procedure:  Amy Hammond is a 62 y.o. female who was identified and appropriate procedural time out was performed.  The patient was then placed supine on the table and prepped and draped in the usual sterile fashion.    Ultrasound was used to evaluate the right common femoral artery.  It was pulsatile and echolucent indicating it was patent .  A micropuncture needle was used to access the left radial artery under direct ultrasound  guidance and an image was recorded for the permanent record.  A 0.035 J wire was advanced without resistance and a 5Fr sheath was placed.  Stiff angled Glidewire and a pigtail catheter was positioned to the level of T10 and lateral view of the aorta was obtained. This localized the SMA and celiac artery origins.  We then transition to a lateral.  Working in the lateral projection pigtail catheter was exchanged for a V S1 catheter over a Stiff Angled Glidewire and the SMA was engaged with the V S1 catheter.  The V S1 catheter was then advanced to the second-order level and hand-injection contrast was then performed of the SMA.  The Glidewire was exchanged for an Amplatz wire and the Ansell sheath positioned so that it was just at the ostia.  This allowed for a magnified improved imaging of the SMA origin.  Once I had assessed the stenosis the Amplatz wire was removed and the V S1 catheter and stiff angled Glidewire were reintroduced.  We reimage the lateral projection to localize the celiac artery origin.  V S1 catheter and stiff angled Glidewire were then negotiated into the hepatic artery which represents second-order catheter placement.  The Ansell sheath was then advanced so that it was positioned in the proximal hepatic artery.  Glidewire was then exchanged through the V S1 for the Amplatz stiff wire.  Sheath was then pulled back so that it was just at the origin of the celiac artery and the aorta.  6000 units of heparin was given and allowed to circulate for several minutes.   Magnified lateral injection at the  origin of the celiac artery was then made after the wire had been negotiated out into the splenic artery. A 7 mm x 16 mm Lifestream stent was advanced over the wire positioned across the lesion follow-up hand injection contrast was used to verify stent placement and then the stent was deployed to 12 atm for 30 seconds. Follow-up imaging demonstrated the stent in excellent position but somewhat  undersized and a 8 mm Lutonix balloon was then advanced over the wire and used to post-dilate the stent to 10 atm for 1 minute. Follow-up imaging now demonstrated an excellent result with a stent that was well approximated and of appropriate diameter to the celiac artery. Distal runoff is preserved.  After review these images the catheters removed over wire oblique view of the right groin is obtained and a minx device deployed without difficulty there are no immediate complications.  Findings:   Aortogram: Aortogram demonstrates the levels of the celiac and SMA.  As we transition into the lateral the repeat aortogram shows a subtotal occlusion of the celiac but also there appears to be a 70% or greater stenosis at the origin of the SMA.  Because of this I elected to select the SMA first and then do a magnified image which does confirm 60 to 70% stenosis at the origin of the SMA.  Given the subtotal occlusion of the celiac I elected to treat this as we had discussed preoperatively and we will follow the SMA for now.  Once we had selected the celiac and was able to get purchase out into the hepatic a 7 mm Lifestream stent was advanced across the ostia.  It was then postdilated with an 8 mm balloon.  Follow-up imaging demonstrated less than 10% residual stenosis with a fully expanded stent.  Summary: Successful recanalization of the celiac with an associated 60 to 70% stenosis of the SMA   Disposition: Patient was taken to the recovery room in stable condition having tolerated the procedure well.  Amy Hammond 02/08/2023,2:30 PM

## 2023-02-08 NOTE — Interval H&P Note (Signed)
History and Physical Interval Note:  02/08/2023 12:36 PM  Amy Hammond  has presented today for surgery, with the diagnosis of Mesenteric Angio   Chronic mesenteric ischemia.  The various methods of treatment have been discussed with the patient and family. After consideration of risks, benefits and other options for treatment, the patient has consented to  Procedure(s): VISCERAL ANGIOGRAPHY (N/A) as a surgical intervention.  The patient's history has been reviewed, patient examined, no change in status, stable for surgery.  I have reviewed the patient's chart and labs.  Questions were answered to the patient's satisfaction.     Levora Dredge

## 2023-02-08 NOTE — Progress Notes (Signed)
                                                                     MRN : [id number]  Amy Hammond is a 61 y.o. (12/31/1960) female who presents with chief complaint of check circulation.  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   The patient has noted some weight loss as well as nausea.  The patient does substantiate food fear, particular foods do not seem to aggravate or alleviate the symptoms.  The patient denies bloody bowel movements or diarrhea.     No prior peripheral angiograms or vascular interventions.  She has had a heart catheterization before   The patient denies amaurosis fugax or recent TIA symptoms. There are no recent neurological changes noted. The patient denies history of DVT, PE or superficial thrombophlebitis. The patient denies recent episodes of angina    She also endorses having pain in her legs with ambulation but the right leg tends to be more affected.  She notes that there is an area of radiation in the groin with pain down her leg.  There is also an area that aches and throbs in the distal calf portion.  She does have some known lower back issues.   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   Today noninvasive studies show a 70 to 99% stenosis of the celiac artery and superior mesenteric artery.  It is noted that the celiac artery has an abnormal circular shape.  Given that the stenosis is at the region of the celiac artery, I suspect that this is consistent with the poststenotic dilatation noted in the CT scan in 2020   Current Meds  Medication Sig   buPROPion (WELLBUTRIN XL) 300 MG 24 hr tablet Take 300 mg by mouth daily.   CALCIUM CARBONATE-VIT D-MIN PO Take 1 tablet by mouth 2  (two) times daily.   Cyanocobalamin (VITAMIN B 12 PO) Take 1 tablet by mouth 2 (two) times daily.   fluticasone (FLONASE) 50 MCG/ACT nasal spray Place 2 sprays into both nostrils daily.   folic acid (FOLVITE) 1 MG tablet Take 1 mg by mouth daily.   gabapentin (NEURONTIN) 400 MG capsule Take 400 mg by mouth 3 (three) times daily.   latanoprost (XALATAN) 0.005 % ophthalmic solution INSTILL 1 DROP IN BOTH EYES AT BEDTIME   levothyroxine (SYNTHROID) 25 MCG tablet Take on an empty stomach with a glass of water at least 30-60 minutes before breakfast. Need to recheck Thyroid level before this RX runs out   LINZESS 290 MCG CAPS capsule Take 290 mcg by mouth daily.   magnesium oxide (MAG-OX) 400 MG tablet Take by mouth.   meclizine (ANTIVERT) 25 MG tablet Take by mouth.   methotrexate (RHEUMATREX) 10 MG tablet Take 10 mg by mouth once a week. Caution: Chemotherapy. Protect from light.   minoxidil (LONITEN) 2.5 MG tablet Take one tab po QD.   naproxen (NAPROSYN) 500 MG tablet Take by mouth.   pantoprazole (PROTONIX) 40 MG tablet   Take 40 mg by mouth 2 (two) times daily.   pimecrolimus (ELIDEL) 1 % cream Apply topically 2 (two) times daily. As needed   Potassium 95 MG TABS Take by mouth 2 (two) times daily.   tiZANidine (ZANAFLEX) 2 MG tablet TAKE 1/2 TO 2 TABLETS BY MOUTH AT NIGHT AS NEEDED FOR BACK AND HIP PAIN   torsemide (DEMADEX) 20 MG tablet Take by mouth.   venlafaxine (EFFEXOR) 75 MG tablet Take 75 mg by mouth every morning.    Past Medical History:  Diagnosis Date   Amyopathic dermatomyositis (HCC) 2012   Arthritis 2012   osteo   Depression    GERD (gastroesophageal reflux disease)    Hyperlipemia    Interstitial cystitis 2006   Peptic ulcer disease 2012   PMB (postmenopausal bleeding) 2014   Proteinuria 1980    Past Surgical History:  Procedure Laterality Date   ABDOMINAL HYSTERECTOMY     BILATERAL SALPINGECTOMY Bilateral 07/07/2015   Procedure: BILATERAL SALPINGECTOMY;  Surgeon:  Thomas J Schermerhorn, MD;  Location: [facility] ORS;  Service: Gynecology;  Laterality: Bilateral;   CHOLECYSTECTOMY     TUBAL LIGATION     VAGINAL HYSTERECTOMY N/A 07/07/2015   Procedure: HYSTERECTOMY VAGINAL;  Surgeon: Thomas J Schermerhorn, MD;  Location: [facility] ORS;  Service: Gynecology;  Laterality: N/A;    Social History Social History   Tobacco Use   Smoking status: Former    Types: Cigarettes    Quit date: 06/30/2009    Years since quitting: 13.6   Smokeless tobacco: Never  Substance Use Topics   Alcohol use: No   Drug use: No    Family History Family History  Problem Relation Age of Onset   Breast cancer Paternal Grandmother        great gm   Breast cancer Cousin 50       mat cousin   Kidney disease Neg Hx    Kidney cancer Neg Hx    Prostate cancer Neg Hx     Allergies  Allergen Reactions   Penicillins Anaphylaxis and Rash   Etodolac Other (See Comments)    Mouth sores   Macrobid [Nitrofurantoin Monohyd Macro] Itching   Meloxicam Itching   Premarin [Conjugated Estrogens] Itching   Adhesive [Tape] Rash    Paper tape ok to use.   Biaxin [Clarithromycin] Rash     REVIEW OF SYSTEMS (Negative unless checked)  Constitutional: []Weight loss  []Fever  []Chills Cardiac: []Chest pain   []Chest pressure   []Palpitations   []Shortness of breath when laying flat   []Shortness of breath with exertion. Vascular:  [x]Pain in legs with walking   []Pain in legs at rest  []History of DVT   []Phlebitis   []Swelling in legs   []Varicose veins   []Non-healing ulcers Pulmonary:   []Uses home oxygen   []Productive cough   []Hemoptysis   []Wheeze  []COPD   []Asthma Neurologic:  []Dizziness   []Seizures   []History of stroke   []History of TIA  []Aphasia   []Vissual changes   []Weakness or numbness in arm   []Weakness or numbness in leg Musculoskeletal:   []Joint swelling   []Joint pain   []Low back pain Hematologic:  []Easy bruising  []Easy bleeding   []Hypercoagulable state    []Anemic Gastrointestinal:  []Diarrhea   []Vomiting  []Gastroesophageal reflux/heartburn   []Difficulty swallowing. Genitourinary:  []Chronic kidney disease   []Difficult urination  []Frequent urination   []Blood in urine Skin:  []Rashes   []Ulcers  Psychological:  []  History of anxiety   [] History of major depression.  Physical Examination  Vitals:   02/08/23 1109 02/08/23 1129  BP: (!) 152/77 (!) 142/69  Pulse: 77 75  Resp: 10 16  Temp: 98.2 F (36.8 C) 98.2 F (36.8 C)  TempSrc: Oral Oral  SpO2: 99% 97%  Weight: 79.4 kg 79.4 kg  Height: 5' 4" (1.626 m) 5' 5" (1.651 m)   Body mass index is 29.12 kg/m. Gen: WD/WN, NAD Head: [place]/AT, No temporalis wasting.  Ear/Nose/Throat: Hearing grossly intact, nares w/o erythema or drainage Eyes: PER, EOMI, sclera nonicteric.  Neck: Supple, no masses.  No bruit or JVD.  Pulmonary:  Good air movement, no audible wheezing, no use of accessory muscles.  Cardiac: RRR, normal S1, S2, no Murmurs. Vascular:  mild trophic changes, no open wounds Vessel Right Left  Radial Palpable Palpable  Gastrointestinal: soft, non-distended. No guarding/no peritoneal signs.  Musculoskeletal: M/S 5/5 throughout.  No visible deformity.  Neurologic: CN 2-12 intact. Pain and light touch intact in extremities.  Symmetrical.  Speech is fluent. Motor exam as listed above. Psychiatric: Judgment intact, Mood & affect appropriate for pt's clinical situation. Dermatologic: No rashes or ulcers noted.  No changes consistent with cellulitis.   CBC Lab Results  Component Value Date   WBC 10.5 07/07/2015   HGB 12.8 07/07/2015   HCT 38.2 07/07/2015   MCV 94.8 07/07/2015   PLT 200 07/07/2015    BMET    Component Value Date/Time   NA 136 07/08/2015 0712   NA 142 09/03/2011 1543   K 3.6 07/08/2015 0712   K 3.3 (L) 09/03/2011 1543   CL 100 (L) 07/08/2015 0712   CL 106 09/03/2011 1543   CO2 29 07/08/2015 0712   CO2 25 09/03/2011 1543   GLUCOSE 86 07/08/2015 0712    GLUCOSE 93 09/03/2011 1543   BUN 14 02/08/2023 1122   BUN 11 09/03/2011 1543   CREATININE 1.06 (H) 02/08/2023 1122   CREATININE 0.76 09/03/2011 1543   CALCIUM 8.7 (L) 07/08/2015 0712   CALCIUM 8.8 09/03/2011 1543   GFRNONAA 59 (L) 02/08/2023 1122   GFRNONAA >60 09/03/2011 1543   GFRAA >60 07/08/2015 0712   GFRAA >60 09/03/2011 1543   Estimated Creatinine Clearance: 56.6 mL/min (A) (by C-G formula based on SCr of 1.06 mg/dL (H)).  COAG No results found for: "INR", "PROTIME"  Radiology No results found.   Assessment/Plan 1. Celiac artery stenosis (HCC) Recommend:   The patient has evidence of severe atherosclerotic changes of the mesenteric arteries associated with weight loss as well as abdominal pain and N/V.  This represents a high risk for bowel infarction and death.   Patient should undergo angiography of the mesenteric arteries with the hope for intervention to eliminate the ischemic symptoms.     The risks and benefits as well as the alternative therapies was discussed in detail with the patient.  All questions were answered.  Patient agrees to proceed with angiography and intervention.   The patient will follow up with me after the angiogram.   2. Chronic mesenteric ischemia (HCC) See above   3. Pain in both lower extremities I suspect that the pain in the patient's lower extremities is related to her lower back but she has decreased palpable pulses and some of her symptoms are somewhat consistent with claudication.  There is also possible some of this could be related to venous disease.  When the patient follows her mesenteric we can have an ABI done as   well as a venous reflux study on the right lower extremity to evaluate for possible vascular causes of discomfort.   Kaylean Tupou, MD  02/08/2023 12:34 PM   

## 2023-02-09 ENCOUNTER — Encounter: Payer: Self-pay | Admitting: Vascular Surgery

## 2023-02-09 MED ORDER — HEPARIN (PORCINE) IN NACL 1000-0.9 UT/500ML-% IV SOLN
INTRAVENOUS | Status: AC | PRN
  Administered 2023-02-08: 500 mL

## 2023-02-21 ENCOUNTER — Other Ambulatory Visit (INDEPENDENT_AMBULATORY_CARE_PROVIDER_SITE_OTHER): Payer: Self-pay | Admitting: Vascular Surgery

## 2023-02-21 DIAGNOSIS — I771 Stricture of artery: Secondary | ICD-10-CM

## 2023-03-07 ENCOUNTER — Ambulatory Visit (INDEPENDENT_AMBULATORY_CARE_PROVIDER_SITE_OTHER): Payer: BC Managed Care – PPO | Admitting: Nurse Practitioner

## 2023-03-07 ENCOUNTER — Encounter (INDEPENDENT_AMBULATORY_CARE_PROVIDER_SITE_OTHER): Payer: Self-pay | Admitting: Nurse Practitioner

## 2023-03-07 ENCOUNTER — Ambulatory Visit (INDEPENDENT_AMBULATORY_CARE_PROVIDER_SITE_OTHER): Payer: BC Managed Care – PPO

## 2023-03-07 VITALS — BP 149/79 | HR 75 | Resp 17 | Ht 65.0 in | Wt 177.0 lb

## 2023-03-07 DIAGNOSIS — K551 Chronic vascular disorders of intestine: Secondary | ICD-10-CM | POA: Diagnosis not present

## 2023-03-07 DIAGNOSIS — K219 Gastro-esophageal reflux disease without esophagitis: Secondary | ICD-10-CM | POA: Diagnosis not present

## 2023-03-07 DIAGNOSIS — M79605 Pain in left leg: Secondary | ICD-10-CM | POA: Diagnosis not present

## 2023-03-07 DIAGNOSIS — I771 Stricture of artery: Secondary | ICD-10-CM | POA: Diagnosis not present

## 2023-03-07 DIAGNOSIS — M79604 Pain in right leg: Secondary | ICD-10-CM

## 2023-03-08 ENCOUNTER — Encounter (INDEPENDENT_AMBULATORY_CARE_PROVIDER_SITE_OTHER): Payer: Self-pay | Admitting: Nurse Practitioner

## 2023-03-08 NOTE — Progress Notes (Signed)
Subjective:    Patient ID: Amy Hammond, female    DOB: 1960/12/01, 62 y.o.   MRN: [id number] Chief Complaint  Patient presents with   Venous Insufficiency    The patient presents to the office for follow-up regarding chronic mesenteric ischemia associated with stenosis of the SMA and celiac arteries.  Patient underwent angiogram on 02/08/2023 with placement of celiac artery stent.  It was noted that there was a stenosis of the SMA of approximately 60 or so percent.  The patient denies abdominal pain or postprandial symptoms.  The patient denies weight loss as well as nausea.  The patient does not substantiate food fear, particular foods do not seem to aggravate or alleviate the symptoms.  The patient denies bloody bowel movements or diarrhea.  She does continue to have some bloating which was improved initially after the procedure but it has returned.  The patient has a history of colonoscopy which was not diagnostic.    No history of peptic ulcer disease.    The patient denies amaurosis fugax or recent TIA symptoms. There are no recent neurological changes noted. The patient denies claudication symptoms or rest pain symptoms. The patient denies history of DVT, PE or superficial thrombophlebitis. The patient denies recent episodes of angina   She does endorse having pain in her leg primarily her right lower extremity.  She describes it as aching and throbbing in the leg.  This has been ongoing for several months.  She denies any claudication-like symptoms.  She also has a history of right hip pain and chronic back pain as well.  Noninvasive studies show patent celiac artery stent.  No significant stenosis of the SMA or celiac artery.     Review of Systems  Gastrointestinal:        Bloating  All other systems reviewed and are negative.      Objective:   Physical Exam Vitals reviewed.  HENT:     Head: Normocephalic.  Cardiovascular:     Rate and Rhythm: Normal rate.      Pulses: Normal pulses.  Pulmonary:     Effort: Pulmonary effort is normal.  Musculoskeletal:        General: Tenderness present.  Skin:    General: Skin is warm and dry.  Neurological:     Mental Status: She is alert and oriented to person, place, and time.  Psychiatric:        Mood and Affect: Mood normal.        Behavior: Behavior normal.        Thought Content: Thought content normal.        Judgment: Judgment normal.     BP (!) 149/79 (BP Location: Right Arm)   Pulse 75   Resp 17   Ht 5\' 5"  (1.651 m)   Wt 177 lb (80.3 kg)   BMI 29.45 kg/m   Past Medical History:  Diagnosis Date   Amyopathic dermatomyositis (HCC) 2012   Arthritis 2012   osteo   Depression    GERD (gastroesophageal reflux disease)    Hyperlipemia    Interstitial cystitis 2006   Peptic ulcer disease 2012   PMB (postmenopausal bleeding) 2014   Proteinuria 1980    Social History   Socioeconomic History   Marital status: Divorced    Spouse name: Not on file   Number of children: Not on file   Years of education: Not on file   Highest education level: Not on file  Occupational History  Not on file  Tobacco Use   Smoking status: Former    Types: Cigarettes    Quit date: 06/30/2009    Years since quitting: 13.6   Smokeless tobacco: Never  Substance and Sexual Activity   Alcohol use: No   Drug use: No   Sexual activity: Not on file  Other Topics Concern   Not on file  Social History Narrative   Not on file   Social Determinants of Health   Financial Resource Strain: Not on file  Food Insecurity: Not on file  Transportation Needs: Not on file  Physical Activity: Not on file  Stress: Not on file  Social Connections: Not on file  Intimate Partner Violence: Not on file    Past Surgical History:  Procedure Laterality Date   ABDOMINAL HYSTERECTOMY     BILATERAL SALPINGECTOMY Bilateral 07/07/2015   Procedure: BILATERAL SALPINGECTOMY;  Surgeon: Ihor Austin Schermerhorn, MD;  Location: [facility]  ORS;  Service: Gynecology;  Laterality: Bilateral;   CHOLECYSTECTOMY     TUBAL LIGATION     VAGINAL HYSTERECTOMY N/A 07/07/2015   Procedure: HYSTERECTOMY VAGINAL;  Surgeon: Suzy Bouchard, MD;  Location: [facility] ORS;  Service: Gynecology;  Laterality: N/A;   VISCERAL ANGIOGRAPHY N/A 02/08/2023   Procedure: VISCERAL ANGIOGRAPHY;  Surgeon: Renford Dills, MD;  Location: [facility] INV[facility]IVE [facility];  Service: Cardiovascular;  Laterality: N/A;    Family History  Problem Relation Age of Onset   Heart attack Mother    Breast cancer Paternal Grandmother        great gm   Breast cancer Cousin 50       mat cousin   Kidney disease Neg Hx    Kidney cancer Neg Hx    Prostate cancer Neg Hx     Allergies  Allergen Reactions   Penicillins Anaphylaxis and Rash   Etodolac Other (See Comments)    Mouth sores   Macrobid [Nitrofurantoin Monohyd Macro] Itching   Meloxicam Itching   Premarin [Conjugated Estrogens] Itching   Adhesive [Tape] Rash    Paper tape ok to use.   Biaxin [Clarithromycin] Rash       Latest Ref Rng & Units 07/07/2015    2:41 PM 07/01/2015   10:47 AM 09/03/2011    3:43 PM  CBC  WBC 3.6 - 11.0 K/uL 10.5  6.4  7.9   Hemoglobin 12.0 - 16.0 g/dL 16.1  09.6  04.5   Hematocrit 35.0 - 47.0 % 38.2  42.4  41.5   Platelets 150 - 440 K/uL 200  232  288       CMP     Component Value Date/Time   NA 136 07/08/2015 0712   NA 142 09/03/2011 1543   K 3.6 07/08/2015 0712   K 3.3 (L) 09/03/2011 1543   CL 100 (L) 07/08/2015 0712   CL 106 09/03/2011 1543   CO2 29 07/08/2015 0712   CO2 25 09/03/2011 1543   GLUCOSE 86 07/08/2015 0712   GLUCOSE 93 09/03/2011 1543   BUN 14 02/08/2023 1122   BUN 11 09/03/2011 1543   CREATININE 1.06 (H) 02/08/2023 1122   CREATININE 0.76 09/03/2011 1543   CALCIUM 8.7 (L) 07/08/2015 0712   CALCIUM 8.8 09/03/2011 1543   PROT 7.3 09/03/2011 1543   ALBUMIN 4.0 09/03/2011 1543   AST 38 (H) 09/03/2011 1543   ALT 49 09/03/2011 1543   ALKPHOS 137 (H)  09/03/2011 1543   BILITOT 0.5 09/03/2011 1543   GFRNONAA 59 (L) 02/08/2023 1122  GFRNONAA >60 09/03/2011 1543     No results found.     Assessment & Plan:   1. Chronic mesenteric ischemia (HCC) Recommend:  The patient is status post successful angiogram with intervention of the mesenteric vessels.    The patient reports that the abdominal pain is improved and the post prandial symptoms are essentially gone but she does have continued issues with bloating.  The patient denies lifestyle limiting changes at this point in time.  No further invasive studies, angiography or surgery at this time The patient should continue walking and begin a more formal exercise program.  The patient should continue antiplatelet therapy and aggressive treatment of the lipid abnormalities  Patient should undergo noninvasive studies as ordered. The patient will follow up with me after the studies.   2. Pain in both lower extremities I suspect that the pain in the patient's lower extremity is related to varicose veins however there is a possibility it may be related to her hip or lower back issues.  When the patient returns in 3 months we will have her undergo noninvasive studies to evaluate for venous reflux to determine if this may be the cause of her lower extremity aching and pain.  3. Gastroesophageal reflux disease, unspecified whether esophagitis present Continue PPI as already ordered, this medication has been reviewed and there are no changes at this time.  Avoidence of caffeine and alcohol  Moderate elevation of the head of the bed    Current Outpatient Medications on File Prior to Visit  Medication Sig Dispense Refill   aspirin EC 81 MG tablet Take 1 tablet (81 mg total) by mouth daily. Swallow whole. 150 tablet 1   atorvastatin (LIPITOR) 10 MG tablet Take 1 tablet (10 mg total) by mouth daily. 30 tablet 2   buPROPion (WELLBUTRIN XL) 300 MG 24 hr tablet Take 300 mg by mouth daily.      CALCIUM CARBONATE-VIT D-MIN PO Take 1 tablet by mouth 2 (two) times daily.     clopidogrel (PLAVIX) 75 MG tablet Take 1 tablet (75 mg total) by mouth daily. 30 tablet 5   fluticasone (FLONASE) 50 MCG/ACT nasal spray Place 2 sprays into both nostrils daily.     folic acid (FOLVITE) 1 MG tablet Take 1 mg by mouth daily.     gabapentin (NEURONTIN) 400 MG capsule Take 400 mg by mouth 3 (three) times daily.     latanoprost (XALATAN) 0.005 % ophthalmic solution INSTILL 1 DROP IN BOTH EYES AT BEDTIME     levothyroxine (SYNTHROID) 25 MCG tablet Take on an empty stomach with a glass of water at least 30-60 minutes before breakfast. Need to recheck Thyroid level before this RX runs out     LINZESS 290 MCG CAPS capsule Take 290 mcg by mouth daily.     magnesium oxide (MAG-OX) 400 MG tablet Take by mouth.     meclizine (ANTIVERT) 25 MG tablet Take by mouth.     Melatonin 1 MG TABS Take 1 tablet by mouth at bedtime.     minoxidil (LONITEN) 2.5 MG tablet Take one tab po QD. 90 tablet 2   naproxen (NAPROSYN) 500 MG tablet Take by mouth.     pantoprazole (PROTONIX) 40 MG tablet Take 40 mg by mouth 2 (two) times daily.     Potassium 95 MG TABS Take by mouth 2 (two) times daily.     tiZANidine (ZANAFLEX) 2 MG tablet TAKE 1/2 TO 2 TABLETS BY MOUTH AT NIGHT AS NEEDED FOR  BACK AND HIP PAIN     torsemide (DEMADEX) 20 MG tablet Take by mouth.     venlafaxine (EFFEXOR) 75 MG tablet Take 75 mg by mouth every morning.     Cyanocobalamin (VITAMIN B 12 PO) Take 1 tablet by mouth 2 (two) times daily.     docusate sodium (COLACE) 100 MG capsule Take 1 capsule (100 mg total) by mouth 2 (two) times daily. (Patient not taking: Reported on 12/13/2022) 60 capsule 0   Current Facility-Administered Medications on File Prior to Visit  Medication Dose Route Frequency Provider Last Rate Last Admin   Heparin (Porcine) in NaCl 1000-0.9 UT/500ML-% SOLN    PRN Schnier, Latina Craver, MD   500 mL at 02/08/23 1411    There are no Patient  Instructions on file for this visit. No follow-ups on file.   Georgiana Spinner, NP

## 2023-06-09 ENCOUNTER — Encounter (INDEPENDENT_AMBULATORY_CARE_PROVIDER_SITE_OTHER): Payer: BC Managed Care – PPO

## 2023-06-09 ENCOUNTER — Ambulatory Visit (INDEPENDENT_AMBULATORY_CARE_PROVIDER_SITE_OTHER): Payer: BC Managed Care – PPO | Admitting: Vascular Surgery

## 2023-07-05 ENCOUNTER — Other Ambulatory Visit (INDEPENDENT_AMBULATORY_CARE_PROVIDER_SITE_OTHER): Payer: Self-pay | Admitting: Nurse Practitioner

## 2023-07-05 DIAGNOSIS — K551 Chronic vascular disorders of intestine: Secondary | ICD-10-CM

## 2023-07-05 DIAGNOSIS — M79604 Pain in right leg: Secondary | ICD-10-CM

## 2023-07-07 ENCOUNTER — Ambulatory Visit: Payer: BC Managed Care – PPO | Admitting: Dermatology

## 2023-07-07 DIAGNOSIS — L719 Rosacea, unspecified: Secondary | ICD-10-CM | POA: Diagnosis not present

## 2023-07-07 DIAGNOSIS — Z7189 Other specified counseling: Secondary | ICD-10-CM

## 2023-07-07 DIAGNOSIS — Z79899 Other long term (current) drug therapy: Secondary | ICD-10-CM

## 2023-07-07 DIAGNOSIS — L649 Androgenic alopecia, unspecified: Secondary | ICD-10-CM | POA: Diagnosis not present

## 2023-07-07 DIAGNOSIS — L820 Inflamed seborrheic keratosis: Secondary | ICD-10-CM

## 2023-07-07 MED ORDER — MINOXIDIL 2.5 MG PO TABS: ORAL_TABLET | ORAL | 2 refills | Status: DC

## 2023-07-07 NOTE — Progress Notes (Signed)
Follow-Up Visit   Subjective  Amy Hammond is a 62 y.o. female who presents for the following: Rosacea of the face. She is controlled with [facility] metronidazole/ivermectin/azelaic acid cream twice daily.  She is taking Minoxidil 2.5mg  tablets daily for androgenetic alopecia. She hasn't noticed any new improvement, but no worsening.  She has an area behind left ear that was treated at last visit. Area got smoother, but grew back and is itchy.    The following portions of the chart were reviewed this encounter and updated as appropriate: medications, allergies, medical history  Review of Systems:  No other skin or systemic complaints except as noted in HPI or Assessment and Plan.  Objective  Well appearing patient in no apparent distress; mood and affect are within normal limits.  Areas Examined: face  Relevant physical exam findings are noted in the Assessment and Plan.  R mastoid x 1 Erythematous stuck-on, waxy papule or plaque    Assessment & Plan   Inflamed seborrheic keratosis R mastoid x 1  Residual. Symptomatic, irritating, patient would like treated.  Destruction of lesion - R mastoid x 1 Complexity: simple   Destruction method: cryotherapy   Informed consent: discussed and consent obtained   Timeout:  patient name, date of birth, surgical site, and procedure verified Lesion destroyed using liquid nitrogen: Yes   Region frozen until ice ball extended beyond lesion: Yes   Outcome: patient tolerated procedure well with no complications   Post-procedure details: wound care instructions given    Androgenetic alopecia  Related Medications minoxidil (LONITEN) 2.5 MG tablet Take one tab po QD.  ROSACEA  Exam:  Mild erythema.  Chronic condition with duration or expected duration over one year. Currently well-controlled.   Rosacea is a chronic progressive skin condition usually affecting the face of adults, causing redness and/or acne bumps. It is  treatable but not curable. It sometimes affects the eyes (ocular rosacea) as well. It may respond to topical and/or systemic medication and can flare with stress, sun exposure, alcohol, exercise, topical steroids (including hydrocortisone/cortisone 10) and some foods.  Daily application of broad spectrum spf 30+ sunscreen to face is recommended to reduce flares.  Treatment Plan Continue [facility] metronidazole/ivermectin/azelaic acid cream Apply to face twice daily 1 yr Rf.  Long term medication management.  Patient is using long term (months to years) prescription medication  to control their dermatologic condition.  These medications require periodic monitoring to evaluate for efficacy and side effects.   ANDROGENETIC ALOPECIA (FEMALE PATTERN HAIR LOSS) Exam: Diffuse thinning of the crown and widening of the midline part with retention of the frontal hairline  Chronic and persistent condition with duration or expected duration over one year. Condition is symptomatic/ bothersome to patient. Not currently at goal.   Female Androgenic Alopecia is a chronic condition related to genetics and/or hormonal changes.  In women androgenetic alopecia is commonly associated with menopause but may occur any time after puberty.  It causes hair thinning primarily on the crown with widening of the part and temporal hairline recession.  Can use OTC Rogaine (minoxidil) 5% solution/foam as directed.  Oral treatments in female patients who have no contraindication may include : - Low dose oral minoxidil 1.25 - 5mg  daily - Spironolactone 50 - 100mg  bid - Finasteride 2.5 - 5 mg daily Adjunctive therapies include: - Low Level Laser Light Therapy (LLLT) - Platelet-rich plasma injections (PRP) - Hair Transplants or scalp reduction   Treatment Plan: Continue Minoxidil 2.5 mg take  1 po daily dsp #90 2Rf.  Recommend OTC biotin supplement 2.5 -5 mg daily. Biotin does not promote hair growth except in the setting  of underlying biotin deficiency.  Sometimes biotin can cause false results in thyroid function tests, so let your doctor know if you are taking it on a regular basis.   Doses of minoxidil for hair loss are considered 'low dose'. This is because the doses used for hair loss are much lower than the doses which are used for conditions such as high blood pressure (hypertension). The doses used for hypertension are 10-40mg  per day.  Side effects are uncommon at the low doses (up to 2.5 mg/day) used to treat hair loss. Potential side effects, more commonly seen at higher doses, include: Increase in hair growth (hypertrichosis) elsewhere on face and body Temporary hair shedding upon starting medication which may last up to 4 weeks Ankle swelling, fluid retention, rapid weight gain more than 5 pounds Low blood pressure and feeling lightheaded or dizzy when standing up quickly Fast or irregular heartbeat Headaches  Long term medication management.  Patient is using long term (months to years) prescription medication  to control their dermatologic condition.  These medications require periodic monitoring to evaluate for efficacy and side effects and may require periodic laboratory monitoring.   Return in about 1 year (around 07/06/2024) for TBSE.  Wendee Beavers, CMA, am acting as scribe for Armida Sans, MD .   Documentation: I have reviewed the above documentation for accuracy and completeness, and I agree with the above.  Armida Sans, MD

## 2023-07-07 NOTE — Patient Instructions (Addendum)
Recommend OTC biotin supplement 2.5 -5 mg daily. Biotin does not promote hair growth except in the setting of underlying biotin deficiency.  Sometimes biotin can cause false results in thyroid function tests, so let your doctor know if you are taking it on a regular basis. Stop taking Biotin before annual lab tests.    Due to recent changes in healthcare laws, you may see results of your pathology and/or laboratory studies on MyChart before the doctors have had a chance to review them. We understand that in some cases there may be results that are confusing or concerning to you. Please understand that not all results are received at the same time and often the doctors may need to interpret multiple results in order to provide you with the best plan of care or course of treatment. Therefore, we ask that you please give [name] 2 business days to thoroughly review all your results before contacting the office for clarification. Should we see a critical lab result, you will be contacted sooner.   If You Need Anything After Your Visit  If you have any questions or concerns for your doctor, please call our main line at [phone] and press option 4 to reach your doctor's medical assistant. If no one answers, please leave a voicemail as directed and we will return your call as soon as possible. Messages left after 4 pm will be answered the following business day.   You may also send [name] a message via MyChart. We typically respond to MyChart messages within 1-2 business days.  For prescription refills, please ask your pharmacy to contact our office. Our fax number is [phone].  If you have an urgent issue when the clinic is closed that cannot wait until the next business day, you can page your doctor at the number below.    Please note that while we do our best to be available for urgent issues outside of office hours, we are not available 24/7.   If you have an urgent issue and are unable to reach [name],  you may choose to seek medical care at your doctor's office, retail clinic, urgent care center, or emergency room.  If you have a medical emergency, please immediately call 911 or go to the emergency department.  Pager Numbers  - Dr. Gwen Pounds: [phone]  - Dr. Roseanne Reno: [phone]  - Dr. Katrinka Blazing: [phone]   In the event of inclement weather, please call our main line at [phone] for an update on the status of any delays or closures.  Dermatology Medication Tips: Please keep the boxes that topical medications come in in order to help keep track of the instructions about where and how to use these. Pharmacies typically print the medication instructions only on the boxes and not directly on the medication tubes.   If your medication is too expensive, please contact our office at [phone] option 4 or send [name] a message through MyChart.   We are unable to tell what your co-pay for medications will be in advance as this is different depending on your insurance coverage. However, we may be able to find a substitute medication at lower cost or fill out paperwork to get insurance to cover a needed medication.   If a prior authorization is required to get your medication covered by your insurance company, please allow [name] 1-2 business days to complete this process.  Drug prices often vary depending on where the prescription is filled and some pharmacies may offer cheaper prices.  The website  www.goodrx.com contains coupons for medications through different pharmacies. The prices here do not account for what the cost may be with help from insurance (it may be cheaper with your insurance), but the website can give you the price if you did not use any insurance.  - You can print the associated coupon and take it with your prescription to the pharmacy.  - You may also stop by our office during regular business hours and pick up a GoodRx coupon card.  - If you need your prescription sent  electronically to a different pharmacy, notify our office through [facility] or by phone at [phone] option 4.     Si Usted Necesita Algo Despus de Su Visita  Tambin puede enviarnos un mensaje a travs de Clinical cytogeneticist. Por lo general respondemos a los mensajes de MyChart en el transcurso de 1 a 2 das hbiles.  Para renovar recetas, por favor pida a su farmacia que se ponga en contacto con nuestra oficina. Annie Sable de fax es [place] [phone].  Si tiene un asunto urgente cuando la clnica est cerrada y que no puede esperar hasta el siguiente da hbil, puede llamar/localizar a su doctor(a) al nmero que aparece a continuacin.   Por favor, tenga en cuenta que aunque hacemos todo lo posible para estar disponibles para asuntos urgentes fuera del horario de [place], no estamos disponibles las 24 horas del da, los 7 [address]  [address] de la [place].   Si tiene un problema urgente y no puede comunicarse con nosotros, puede optar por buscar atencin mdica  en el consultorio de su doctor(a), en una clnica privada, en un centro de atencin urgente o en una sala de emergencias.  Si tiene Engineer, drilling, por favor llame inmediatamente al 911 o vaya a la sala de emergencias.  Nmeros de bper  - Dr. Gwen Pounds: [phone]  - Dra. Roseanne Reno: [phone]  - Dr. Katrinka Blazing: [phone]   En caso de inclemencias del tiempo, por favor llame a Lacy Duverney principal al [phone] para una actualizacin sobre el [place] de cualquier retraso o cierre.  Consejos para la medicacin en dermatologa: Por favor, guarde las cajas en las que vienen los medicamentos de uso tpico para ayudarle a seguir las instrucciones sobre dnde y cmo usarlos. Las farmacias generalmente imprimen las instrucciones del medicamento slo en las cajas y no directamente en los tubos del [place].   Si su medicamento es muy caro, por favor, pngase en contacto con Rolm Gala llamando al [phone] y presione la  opcin 4 o envenos un mensaje a travs de Clinical cytogeneticist.   No podemos decirle cul ser su copago por los medicamentos por adelantado ya que esto es diferente dependiendo de la cobertura de su seguro. Sin embargo, es posible que podamos encontrar un medicamento sustituto a Audiological scientist un formulario para que el seguro cubra el medicamento que se considera necesario.   Si se requiere una autorizacin previa para que su compaa de seguros [place] su medicamento, por favor permtanos de 1 a 2 das hbiles para completar [address]  Los precios de los medicamentos varan con frecuencia dependiendo del Environmental consultant de dnde se surte la receta y alguna farmacias pueden ofrecer precios ms baratos.  El sitio web www.goodrx.com tiene cupones para medicamentos de Health and safety inspector. Los precios aqu no tienen en cuenta lo que podra costar con la ayuda del seguro (puede ser ms barato con su seguro), pero el sitio web puede darle el precio si no utiliz Tourist information centre manager.  - Puede  imprimir el cupn correspondiente y llevarlo con su receta a la farmacia.  - Tambin puede pasar por nuestra oficina durante el horario de atencin regular y Education officer, museum una tarjeta de cupones de GoodRx.  - Si necesita que su receta se enve electrnicamente a una farmacia diferente, informe a nuestra oficina a travs de MyChart de [place] o por telfono llamando al [phone] y presione la opcin 4.

## 2023-07-11 ENCOUNTER — Ambulatory Visit (INDEPENDENT_AMBULATORY_CARE_PROVIDER_SITE_OTHER): Payer: BC Managed Care – PPO

## 2023-07-11 ENCOUNTER — Ambulatory Visit (INDEPENDENT_AMBULATORY_CARE_PROVIDER_SITE_OTHER): Payer: BC Managed Care – PPO | Admitting: Vascular Surgery

## 2023-07-11 ENCOUNTER — Encounter (INDEPENDENT_AMBULATORY_CARE_PROVIDER_SITE_OTHER): Payer: Self-pay | Admitting: Vascular Surgery

## 2023-07-11 VITALS — BP 127/76 | HR 76 | Resp 18 | Ht 65.0 in | Wt 178.8 lb

## 2023-07-11 DIAGNOSIS — K219 Gastro-esophageal reflux disease without esophagitis: Secondary | ICD-10-CM | POA: Diagnosis not present

## 2023-07-11 DIAGNOSIS — I774 Celiac artery compression syndrome: Secondary | ICD-10-CM

## 2023-07-11 DIAGNOSIS — I890 Lymphedema, not elsewhere classified: Secondary | ICD-10-CM | POA: Diagnosis not present

## 2023-07-11 DIAGNOSIS — K551 Chronic vascular disorders of intestine: Secondary | ICD-10-CM

## 2023-07-11 DIAGNOSIS — M79604 Pain in right leg: Secondary | ICD-10-CM

## 2023-07-11 DIAGNOSIS — E039 Hypothyroidism, unspecified: Secondary | ICD-10-CM

## 2023-07-11 DIAGNOSIS — M79605 Pain in left leg: Secondary | ICD-10-CM | POA: Diagnosis not present

## 2023-07-11 NOTE — Progress Notes (Unsigned)
MRN : [id number]  Amy Hammond is a 62 y.o. ([birth year]) female who presents with chief complaint of check circulation.  History of Present Illness:  The patient returns to the office for follow-up regarding chronic mesenteric ischemia.  The patient is status post angiography with intervention of the celiac artery.  Procedure date February 08, 2023:   Percutaneous transluminal angioplasty and stent placement of the celiac artery  Selective injection of the SMA second-order catheter placement.   The patient reports that since the intervention the abdominal pain and postprandial symptoms are much better.  The patient denies further weight loss and the previous chronic nausea is improved.  The patient does not substantiate food fear.  The patient denies bloody bowel movements or diarrhea.    No history of peptic ulcer disease.   No prior peripheral angiograms or vascular interventions.  The patient denies amaurosis fugax or recent TIA symptoms. There are no recent neurological changes noted. The patient denies claudication symptoms or rest pain symptoms. The patient denies history of DVT, PE or superficial thrombophlebitis. The patient denies recent episodes of angina   No outpatient medications have been marked as taking for the 07/11/23 encounter (Appointment) with Gilda Crease, Latina Craver, MD.    Past Medical History:  Diagnosis Date   Amyopathic dermatomyositis (HCC) 2012   Arthritis 2012   osteo   Depression    GERD (gastroesophageal reflux disease)    Hyperlipemia    Interstitial cystitis 2006   Peptic ulcer disease 2012   PMB (postmenopausal bleeding) 2014   Proteinuria 1980    Past Surgical History:  Procedure Laterality Date   ABDOMINAL HYSTERECTOMY     BILATERAL SALPINGECTOMY Bilateral 07/07/2015   Procedure: BILATERAL SALPINGECTOMY;  Surgeon: Suzy Bouchard, MD;  Location: [facility] ORS;   Service: Gynecology;  Laterality: Bilateral;   CHOLECYSTECTOMY     TUBAL LIGATION     VAGINAL HYSTERECTOMY N/A 07/07/2015   Procedure: HYSTERECTOMY VAGINAL;  Surgeon: Suzy Bouchard, MD;  Location: [facility] ORS;  Service: Gynecology;  Laterality: N/A;   VISCERAL ANGIOGRAPHY N/A 02/08/2023   Procedure: VISCERAL ANGIOGRAPHY;  Surgeon: Renford Dills, MD;  Location: [facility] IN[facility]IVE [facility];  Service: Cardiovascular;  Laterality: N/A;    Social History Social History   Tobacco Use   Smoking status: Former    Current packs/day: 0.00    Types: Cigarettes    Quit date: 06/30/2009    Years since quitting: 14.0   Smokeless tobacco: Never  Substance Use Topics   Alcohol use: No   Drug use: No    Family History Family History  Problem Relation Age of Onset   Heart attack Mother    Breast cancer Paternal Grandmother        great gm   Breast cancer Cousin 50       mat cousin   Kidney disease Neg Hx    Kidney cancer Neg Hx    Prostate cancer Neg Hx     Allergies  Allergen Reactions   Penicillins Anaphylaxis and Rash   Etodolac Other (See Comments)    Mouth sores  Macrobid [Nitrofurantoin Monohyd Macro] Itching   Meloxicam Itching   Premarin [Conjugated Estrogens] Itching   Adhesive [Tape] Rash    Paper tape ok to use.   Biaxin [Clarithromycin] Rash     REVIEW OF SYSTEMS (Negative unless checked)  Constitutional: [] Weight loss  [] Fever  [] Chills Cardiac: [] Chest pain   [] Chest pressure   [] Palpitations   [] Shortness of breath when laying flat   [] Shortness of breath with exertion. Vascular:  [x] Pain in legs with walking   [] Pain in legs at rest  [] History of DVT   [] Phlebitis   [] Swelling in legs   [] Varicose veins   [] Non-healing ulcers Pulmonary:   [] Uses home oxygen   [] Productive cough   [] Hemoptysis   [] Wheeze  [] COPD   [] Asthma Neurologic:  [] Dizziness   [] Seizures   [] History of stroke   [] History of TIA  [] Aphasia   [] Vissual changes   [] Weakness or numbness in arm    [] Weakness or numbness in leg Musculoskeletal:   [] Joint swelling   [] Joint pain   [] Low back pain Hematologic:  [] Easy bruising  [] Easy bleeding   [] Hypercoagulable state   [] Anemic Gastrointestinal:  [] Diarrhea   [] Vomiting  [] Gastroesophageal reflux/heartburn   [] Difficulty swallowing. Genitourinary:  [] Chronic kidney disease   [] Difficult urination  [] Frequent urination   [] Blood in urine Skin:  [] Rashes   [] Ulcers  Psychological:  [] History of anxiety   []  History of major depression.  Physical Examination  There were no vitals filed for this visit. There is no height or weight on file to calculate BMI. Gen: WD/WN, NAD Head: [place]/AT, No temporalis wasting.  Ear/Nose/Throat: Hearing grossly intact, nares w/o erythema or drainage Eyes: PER, EOMI, sclera nonicteric.  Neck: Supple, no masses.  No bruit or JVD.  Pulmonary:  Good air movement, no audible wheezing, no use of accessory muscles.  Cardiac: RRR, normal S1, S2, no Murmurs. Vascular:  No trophic changes, no open wounds Vessel Right Left  Radial Palpable Palpable  PT Not Palpable Not Palpable  DP Not Palpable Not Palpable  Gastrointestinal: soft, non-distended. No guarding/no peritoneal signs.  Musculoskeletal: M/S 5/5 throughout.  No visible deformity.  Neurologic: CN 2-12 intact. Pain and light touch intact in extremities.  Symmetrical.  Speech is fluent. Motor exam as listed above. Psychiatric: Judgment intact, Mood & affect appropriate for pt's clinical situation. Dermatologic: No rashes or ulcers noted.  No changes consistent with cellulitis.   CBC Lab Results  Component Value Date   WBC 10.5 07/07/2015   HGB 12.8 07/07/2015   HCT 38.2 07/07/2015   MCV 94.8 07/07/2015   PLT 200 07/07/2015    BMET    Component Value Date/Time   NA 136 07/08/2015 0712   NA 142 09/03/2011 1543   K 3.6 07/08/2015 0712   K 3.3 (L) 09/03/2011 1543   CL 100 (L) 07/08/2015 0712   CL 106 09/03/2011 1543   CO2 29 07/08/2015 0712    CO2 25 09/03/2011 1543   GLUCOSE 86 07/08/2015 0712   GLUCOSE 93 09/03/2011 1543   BUN 14 02/08/2023 1122   BUN 11 09/03/2011 1543   CREATININE 1.06 (H) 02/08/2023 1122   CREATININE 0.76 09/03/2011 1543   CALCIUM 8.7 (L) 07/08/2015 0712   CALCIUM 8.8 09/03/2011 1543   GFRNONAA 59 (L) 02/08/2023 1122   GFRNONAA >60 09/03/2011 1543   GFRAA >60 07/08/2015 0712   GFRAA >60 09/03/2011 1543   CrCl cannot be calculated (Patient's most recent lab result is older than the maximum 21  days allowed.).  COAG No results found for: "INR", "PROTIME"  Radiology No results found.   Assessment/Plan There are no diagnoses linked to this encounter.   Levora Dredge, MD  07/11/2023 8:42 AM

## 2023-07-13 ENCOUNTER — Encounter (INDEPENDENT_AMBULATORY_CARE_PROVIDER_SITE_OTHER): Payer: Self-pay | Admitting: Vascular Surgery

## 2023-07-13 DIAGNOSIS — I890 Lymphedema, not elsewhere classified: Secondary | ICD-10-CM | POA: Insufficient documentation

## 2023-07-13 IMAGING — MG MM DIGITAL SCREENING BILAT W/ TOMO AND CAD
8 series · 8 of 24 positions shown · non-contrast
Comparison: Previous exam(s).

ACR Breast Density Category a: The breast tissue is almost entirely
fatty.

CLINICAL DATA: Screening.

EXAM:
DIGITAL SCREENING BILATERAL MAMMOGRAM WITH TOMOSYNTHESIS AND CAD
TECHNIQUE: Bilateral screening digital craniocaudal and mediolateral oblique
mammograms were obtained. Bilateral screening digital breast
tomosynthesis was performed. The images were evaluated with
computer-aided detection.

[R MLO synth-2D]
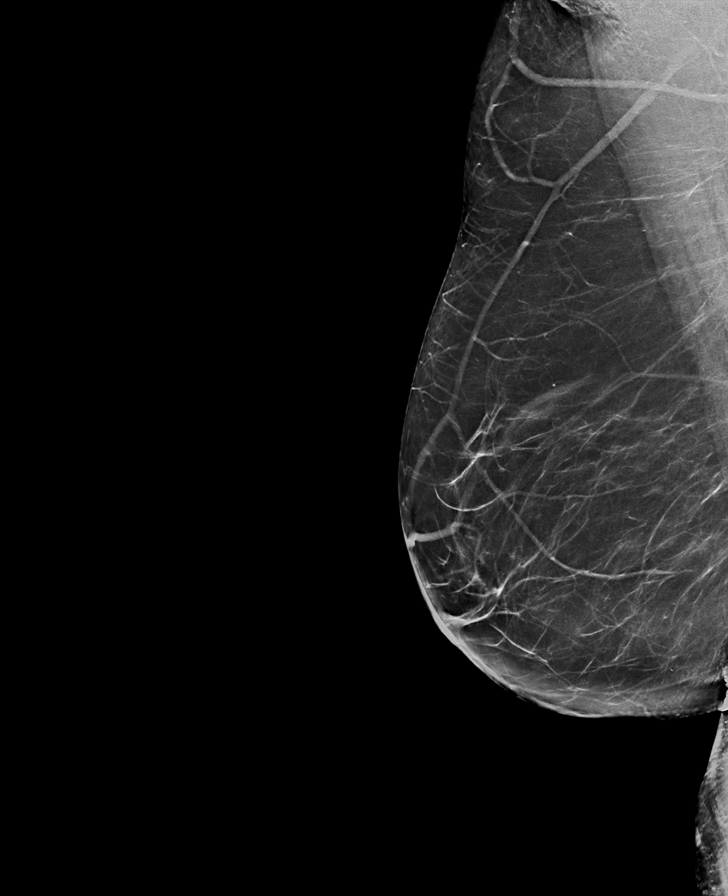

[L MLO synth-2D]
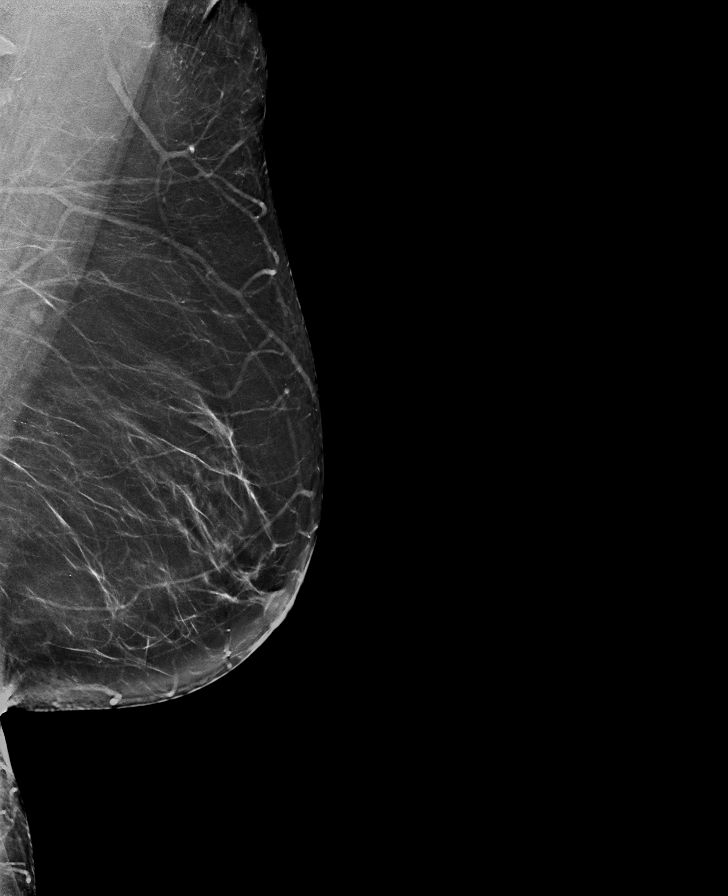

[L CC synth-2D]
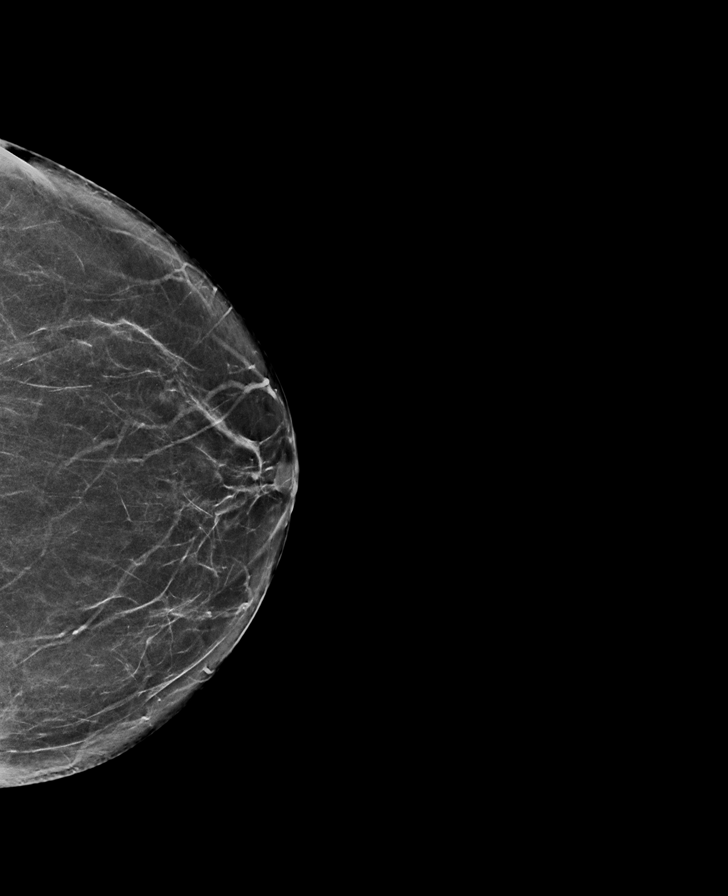

[R CC synth-2D]
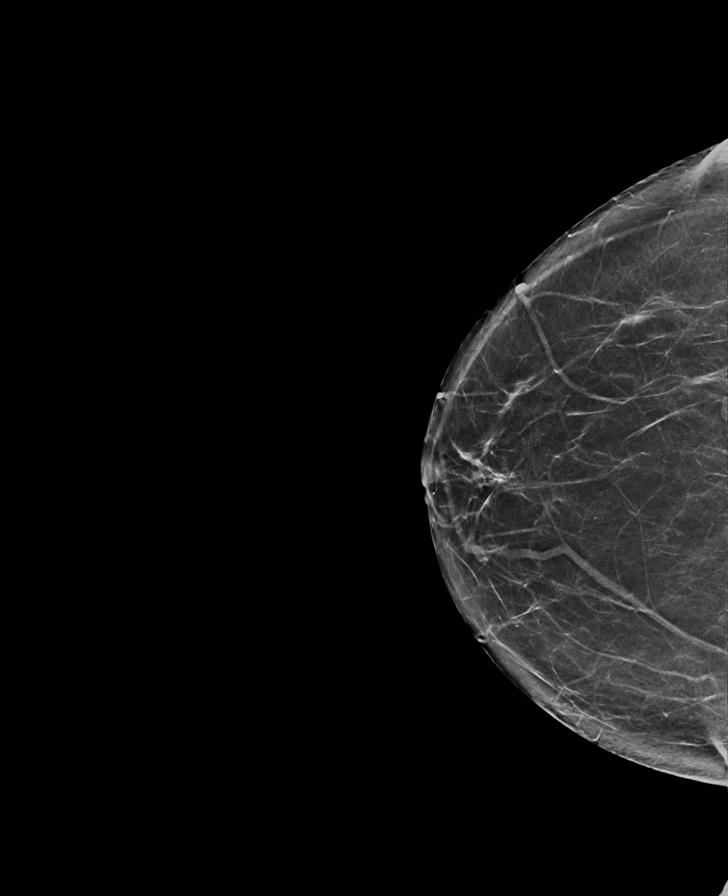

[L MLO tomo · tomo slice 39/76.0]
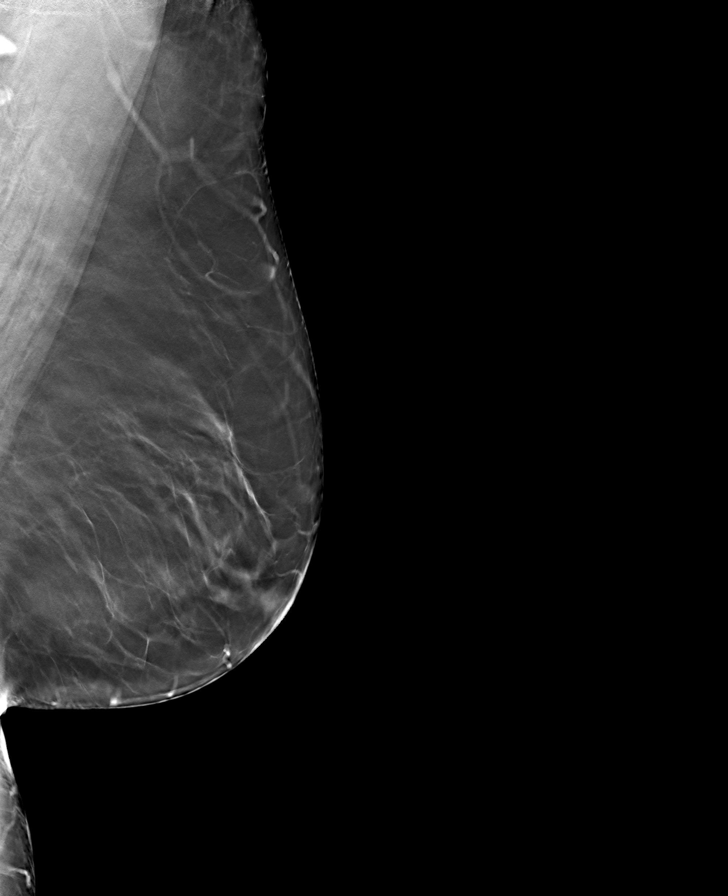

[R CC tomo · tomo slice 33/66.0]
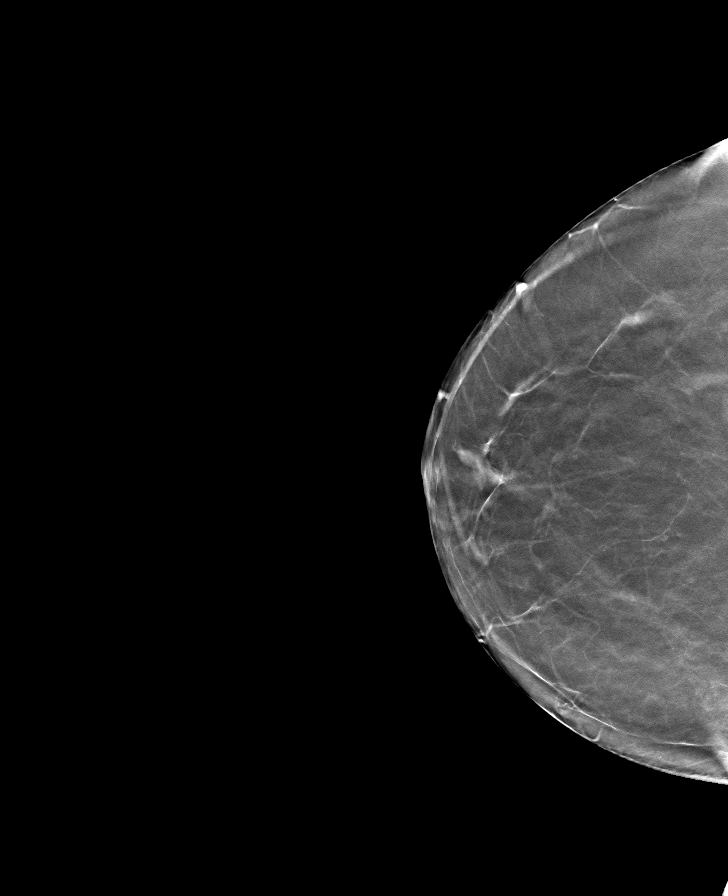

[L CC tomo · tomo slice 35/69.0]
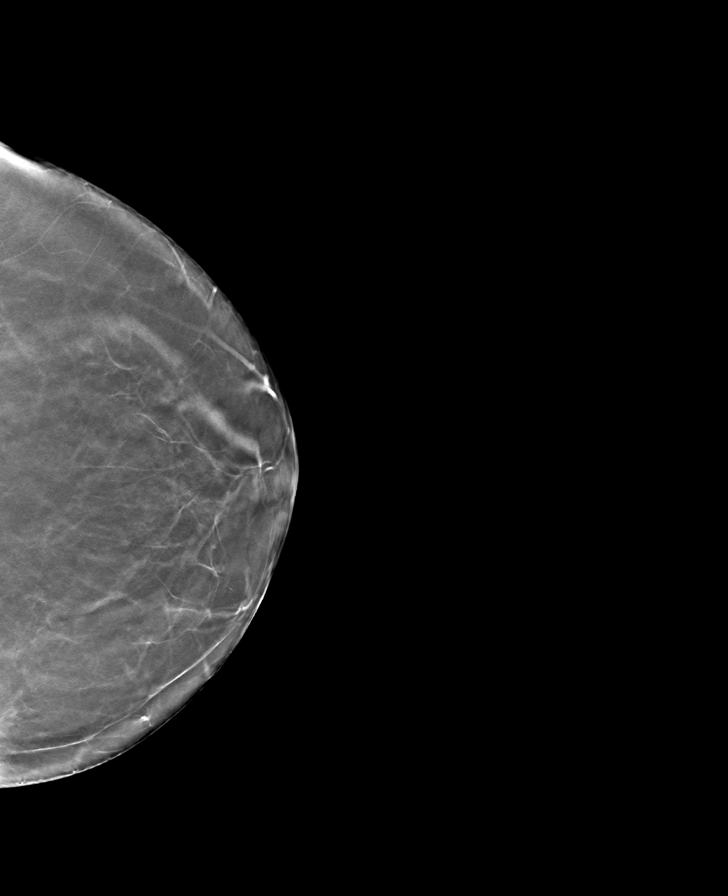

[R MLO tomo · tomo slice 37/73.0]
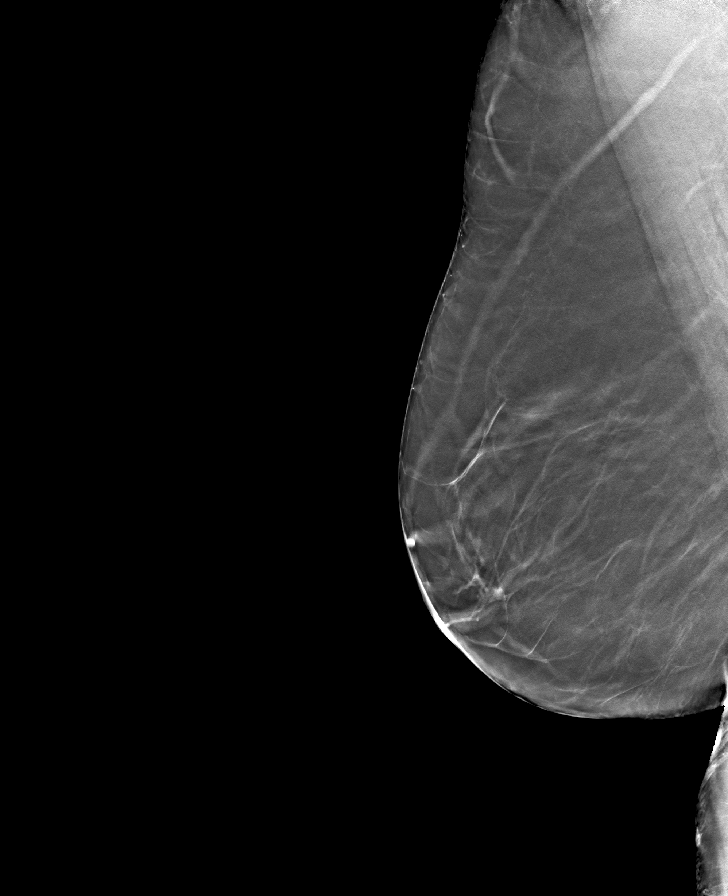

[8 of 24 positions shown; findings below may reference images not displayed]

FINDINGS: There are no findings suspicious for malignancy.
IMPRESSION: No mammographic evidence of malignancy. A result letter of this
screening mammogram will be mailed directly to the patient.

RECOMMENDATION:
Screening mammogram in one year. (Code:0E-3-N98)

BI-RADS CATEGORY  1: Negative.

## 2023-07-16 ENCOUNTER — Encounter: Payer: Self-pay | Admitting: Dermatology

## 2023-08-22 ENCOUNTER — Other Ambulatory Visit (INDEPENDENT_AMBULATORY_CARE_PROVIDER_SITE_OTHER): Payer: Self-pay | Admitting: Vascular Surgery

## 2024-01-02 ENCOUNTER — Ambulatory Visit (INDEPENDENT_AMBULATORY_CARE_PROVIDER_SITE_OTHER): Payer: BC Managed Care – PPO | Admitting: Vascular Surgery

## 2024-01-02 ENCOUNTER — Encounter (INDEPENDENT_AMBULATORY_CARE_PROVIDER_SITE_OTHER): Payer: BC Managed Care – PPO

## 2024-01-16 ENCOUNTER — Other Ambulatory Visit: Payer: Self-pay | Admitting: Orthopedic Surgery

## 2024-01-16 DIAGNOSIS — M51372 Other intervertebral disc degeneration, lumbosacral region with discogenic back pain and lower extremity pain: Secondary | ICD-10-CM

## 2024-01-16 DIAGNOSIS — M5442 Lumbago with sciatica, left side: Secondary | ICD-10-CM

## 2024-01-16 DIAGNOSIS — M4807 Spinal stenosis, lumbosacral region: Secondary | ICD-10-CM

## 2024-01-17 ENCOUNTER — Encounter (INDEPENDENT_AMBULATORY_CARE_PROVIDER_SITE_OTHER): Payer: Self-pay

## 2024-01-19 ENCOUNTER — Ambulatory Visit
Admission: RE | Admit: 2024-01-19 | Discharge: 2024-01-19 | Disposition: A | Discharge status: Home / Self Care | Source: Ambulatory Visit | Attending: Orthopedic Surgery | Admitting: Orthopedic Surgery

## 2024-01-19 DIAGNOSIS — M51372 Other intervertebral disc degeneration, lumbosacral region with discogenic back pain and lower extremity pain: Secondary | ICD-10-CM | POA: Diagnosis present

## 2024-01-19 DIAGNOSIS — M4807 Spinal stenosis, lumbosacral region: Secondary | ICD-10-CM | POA: Diagnosis present

## 2024-01-19 DIAGNOSIS — M5442 Lumbago with sciatica, left side: Secondary | ICD-10-CM | POA: Diagnosis present

## 2024-02-10 ENCOUNTER — Other Ambulatory Visit (INDEPENDENT_AMBULATORY_CARE_PROVIDER_SITE_OTHER): Payer: Self-pay | Admitting: Vascular Surgery

## 2024-02-10 DIAGNOSIS — K551 Chronic vascular disorders of intestine: Secondary | ICD-10-CM

## 2024-02-12 DIAGNOSIS — E785 Hyperlipidemia, unspecified: Secondary | ICD-10-CM | POA: Insufficient documentation

## 2024-02-12 NOTE — Progress Notes (Signed)
 MRN : [id number]  Amy Hammond is a 63 y.o. ([birth year]) female who presents with chief complaint of check circulation.  History of Present Illness:   The patient returns to the office for follow-up regarding chronic mesenteric ischemia.   The patient is status post angiography with intervention of the celiac artery.   Procedure date February 08, 2023:   Percutaneous transluminal angioplasty and stent placement of the celiac artery  Selective injection of the SMA second-order catheter placement.    The patient denies abdominal pain or postprandial symptoms.  The patient denies weight loss as well as nausea.  The patient does not substantiate food fear, particular foods do not seem to aggravate or alleviate the symptoms.  The patient denies bloody bowel movements or diarrhea.  She does continue to have some bloating which was improved initially after the procedure but it has returned.   No history of peptic ulcer disease.    She is also followed for leg swelling.  The swelling has persisted and the discomfort associated with swelling continues. There have not been any interval development of a ulcerations or wounds.   Since the previous visit the patient has been wearing graduated compression stockings and has noted little if any improvement in the lymphedema. The patient has been using compression routinely morning until night.   The patient also states elevation during the day and exercise is being done too.   The patient denies amaurosis fugax or recent TIA symptoms. There are no recent neurological changes noted. The patient denies claudication symptoms or rest pain symptoms. The patient denies history of DVT, PE or superficial thrombophlebitis. The patient denies recent episodes of angina.   Duplex ultrasound of the venous system demonstrates a normal venous system no evidence of superficial reflux or deep  venous reflux.   Duplex US  of the mesenteric arteries done today shows patent celiac artery stent.  No significant stenosis of the SMA or celiac artery.  No change compared to the previous study.  No outpatient medications have been marked as taking for the 02/13/24 encounter (Appointment) with Jama, Cordella MATSU, MD.    Past Medical History:  Diagnosis Date   Amyopathic dermatomyositis (HCC) 2012   Arthritis 2012   osteo   Depression    GERD (gastroesophageal reflux disease)    Hyperlipemia    Interstitial cystitis 2006   Peptic ulcer disease 2012   PMB (postmenopausal bleeding) 2014   Proteinuria 1980    Past Surgical History:  Procedure Laterality Date   ABDOMINAL HYSTERECTOMY     BILATERAL SALPINGECTOMY Bilateral 07/07/2015   Procedure: BILATERAL SALPINGECTOMY;  Surgeon: Debby JINNY Dinsmore, MD;  Location: [facility];  Service: Gynecology;  Laterality: Bilateral;   CHOLECYSTECTOMY     TUBAL LIGATION     VAGINAL HYSTERECTOMY N/A 07/07/2015   Procedure: HYSTERECTOMY VAGINAL;  Surgeon: Debby JINNY Dinsmore, MD;  Location: [facility];  Service: Gynecology;  Laterality: N/A;   VISCERAL ANGIOGRAPHY N/A 02/08/2023   Procedure: VISCERAL ANGIOGRAPHY;  Surgeon: Jama Cordella MATSU, MD;  Location: [facility];  Service: Cardiovascular;  Laterality: N/A;  Social History Social History   Tobacco Use   Smoking status: Former    Current packs/day: 0.00    Types: Cigarettes    Quit date: 06/30/2009    Years since quitting: 14.6   Smokeless tobacco: Never  Substance Use Topics   Alcohol use: No   Drug use: No    Family History Family History  Problem Relation Age of Onset   Heart attack Mother    Breast cancer Paternal Grandmother        great gm   Breast cancer Cousin 50       mat cousin   Kidney disease Neg Hx    Kidney cancer Neg Hx    Prostate cancer Neg Hx     Allergies  Allergen Reactions   Penicillins Anaphylaxis and Rash   Etodolac Other (See Comments)     Mouth sores   Macrobid [Nitrofurantoin Monohyd Macro] Itching   Meloxicam Itching   Premarin  [Conjugated Estrogens ] Itching   Adhesive [Tape] Rash    Paper tape ok to use.   Biaxin [Clarithromycin] Rash     REVIEW OF SYSTEMS (Negative unless checked)  Constitutional: [] Weight loss  [] Fever  [] Chills Cardiac: [] Chest pain   [] Chest pressure   [] Palpitations   [] Shortness of breath when laying flat   [] Shortness of breath with exertion. Vascular:  [x] Pain in legs with walking   [] Pain in legs at rest  [] History of DVT   [] Phlebitis   [] Swelling in legs   [] Varicose veins   [] Non-healing ulcers Pulmonary:   [] Uses home oxygen   [] Productive cough   [] Hemoptysis   [] Wheeze  [] COPD   [] Asthma Neurologic:  [] Dizziness   [] Seizures   [] History of stroke   [] History of TIA  [] Aphasia   [] Vissual changes   [] Weakness or numbness in arm   [] Weakness or numbness in leg Musculoskeletal:   [] Joint swelling   [] Joint pain   [] Low back pain Hematologic:  [] Easy bruising  [] Easy bleeding   [] Hypercoagulable state   [] Anemic Gastrointestinal:  [] Diarrhea   [] Vomiting  [x] Gastroesophageal reflux/heartburn   [] Difficulty swallowing. Genitourinary:  [] Chronic kidney disease   [] Difficult urination  [] Frequent urination   [] Blood in urine Skin:  [] Rashes   [] Ulcers  Psychological:  [] History of anxiety   []  History of major depression.  Physical Examination  There were no vitals filed for this visit. There is no height or weight on file to calculate BMI. Gen: WD/WN, NAD Head: [place]/AT, No temporalis wasting.  Ear/Nose/Throat: Hearing grossly intact, nares w/o erythema or drainage Eyes: PER, EOMI, sclera nonicteric.  Neck: Supple, no masses.  No bruit or JVD.  Pulmonary:  Good air movement, no audible wheezing, no use of accessory muscles.  Cardiac: RRR, normal S1, S2, no Murmurs. Vascular:  mild trophic changes, no open wounds scattered varicosities present bilaterally.  Mild venous stasis changes to  the legs bilaterally.  3-4+ soft pitting edema, CEAP [id number]  Vessel Right Left  Radial Palpable Palpable  PT Not Palpable Not Palpable  DP Not Palpable Not Palpable  Gastrointestinal: soft, non-distended. No guarding/no peritoneal signs.  Musculoskeletal: M/S 5/5 throughout.  No visible deformity.  Neurologic: CN 2-12 intact. Pain and light touch intact in extremities.  Symmetrical.  Speech is fluent. Motor exam as listed above. Psychiatric: Judgment intact, Mood & affect appropriate for pt's clinical situation. Dermatologic: No rashes or ulcers noted.  No changes consistent with cellulitis.   CBC Lab Results  Component Value Date   WBC 10.5 07/07/2015  HGB 12.8 07/07/2015   HCT 38.2 07/07/2015   MCV 94.8 07/07/2015   PLT 200 07/07/2015    BMET    Component Value Date/Time   NA 136 07/08/2015 0712   NA 142 09/03/2011 1543   K 3.6 07/08/2015 0712   K 3.3 (L) 09/03/2011 1543   CL 100 (L) 07/08/2015 0712   CL 106 09/03/2011 1543   CO2 29 07/08/2015 0712   CO2 25 09/03/2011 1543   GLUCOSE 86 07/08/2015 0712   GLUCOSE 93 09/03/2011 1543   BUN 14 02/08/2023 1122   BUN 11 09/03/2011 1543   CREATININE 1.06 (H) 02/08/2023 1122   CREATININE 0.76 09/03/2011 1543   CALCIUM  8.7 (L) 07/08/2015 0712   CALCIUM  8.8 09/03/2011 1543   GFRNONAA 59 (L) 02/08/2023 1122   GFRNONAA >60 09/03/2011 1543   GFRAA >60 07/08/2015 0712   GFRAA >60 09/03/2011 1543   CrCl cannot be calculated (Patient's most recent lab result is older than the maximum 21 days allowed.).  COAG No results found for: INR, PROTIME  Radiology No results found.   Assessment/Plan 1. Chronic mesenteric ischemia (HCC) (Primary) Recommend:   The patient is status post successful angiogram with intervention of the mesenteric vessels.     The patient reports that the abdominal pain is improved and the post prandial symptoms are essentially gone but she does have continued issues with bloating.  The patient  denies lifestyle limiting changes at this point in time.   No further invasive studies, angiography or surgery at this time The patient should continue walking and begin a more formal exercise program.  The patient should continue antiplatelet therapy and aggressive treatment of the lipid abnormalities   Patient should undergo noninvasive studies as ordered. The patient will follow up with me after the studies.  - VAS US  MESENTERIC; Future  2. Celiac artery stenosis (HCC) Recommend:   The patient is status post successful angiogram with intervention of the mesenteric vessels.     The patient reports that the abdominal pain is improved and the post prandial symptoms are essentially gone but she does have continued issues with bloating.  The patient denies lifestyle limiting changes at this point in time.   No further invasive studies, angiography or surgery at this time The patient should continue walking and begin a more formal exercise program.  The patient should continue antiplatelet therapy and aggressive treatment of the lipid abnormalities   Patient should undergo noninvasive studies as ordered. The patient will follow up with me after the studies.   3. Lymphedema Recommend:   No surgery or intervention at this point in time.     I have reviewed my discussion with the patient regarding venous insufficiency and secondary lymph edema and why it  causes symptoms. I have discussed with the patient the chronic skin changes that accompany these problems and the long term sequela such as ulceration and infection.  Patient will continue wearing graduated compression on a daily basis a prescription, if needed, was given to the patient to keep this updated. The patient will  put the compression on first thing in the morning and removing them in the evening. The patient is instructed specifically not to sleep in the compression.  In addition, behavioral modification including elevation during  the day will be continued.  Diet and salt restriction will also be helpful.   Previous duplex ultrasound of the lower extremities shows normal deep venous system, superficial reflux was not present.    Following  the review of the ultrasound the patient will follow up in 12 months to reassess the degree of swelling and the control that graduated compression is offering.   The patient can be assessed for a Lymph Pump at that time.  However, at this time the patient states they are satisfied with the control compression and elevation is yielding.   4. Gastroesophageal reflux disease, unspecified whether esophagitis present Continue PPI as already ordered, this medication has been reviewed and there are no changes at this time.  Avoidence of caffeine and alcohol  Moderate elevation of the head of the bed   5. Mixed hyperlipidemia Continue statin as ordered and reviewed, no changes at this time   Cordella Shawl, MD  02/12/2024 2:38 PM

## 2024-02-13 ENCOUNTER — Ambulatory Visit (INDEPENDENT_AMBULATORY_CARE_PROVIDER_SITE_OTHER): Admitting: Vascular Surgery

## 2024-02-13 ENCOUNTER — Ambulatory Visit (INDEPENDENT_AMBULATORY_CARE_PROVIDER_SITE_OTHER)

## 2024-02-13 ENCOUNTER — Encounter (INDEPENDENT_AMBULATORY_CARE_PROVIDER_SITE_OTHER): Payer: Self-pay | Admitting: Vascular Surgery

## 2024-02-13 VITALS — BP 137/84 | HR 74 | Resp 18 | Ht 65.0 in | Wt 180.4 lb

## 2024-02-13 DIAGNOSIS — I774 Celiac artery compression syndrome: Secondary | ICD-10-CM

## 2024-02-13 DIAGNOSIS — I890 Lymphedema, not elsewhere classified: Secondary | ICD-10-CM | POA: Diagnosis not present

## 2024-02-13 DIAGNOSIS — K551 Chronic vascular disorders of intestine: Secondary | ICD-10-CM | POA: Diagnosis not present

## 2024-02-13 DIAGNOSIS — K219 Gastro-esophageal reflux disease without esophagitis: Secondary | ICD-10-CM

## 2024-02-13 DIAGNOSIS — E782 Mixed hyperlipidemia: Secondary | ICD-10-CM

## 2024-02-18 ENCOUNTER — Encounter (INDEPENDENT_AMBULATORY_CARE_PROVIDER_SITE_OTHER): Payer: Self-pay | Admitting: Vascular Surgery

## 2024-02-27 ENCOUNTER — Other Ambulatory Visit: Payer: Self-pay | Admitting: Obstetrics and Gynecology

## 2024-02-27 DIAGNOSIS — N644 Mastodynia: Secondary | ICD-10-CM

## 2024-02-28 ENCOUNTER — Inpatient Hospital Stay
Admission: RE | Admit: 2024-02-28 | Discharge: 2024-02-28 | Discharge status: Home / Self Care | Source: Ambulatory Visit | Attending: Obstetrics and Gynecology | Admitting: Obstetrics and Gynecology

## 2024-02-28 ENCOUNTER — Ambulatory Visit
Admission: RE | Admit: 2024-02-28 | Discharge: 2024-02-28 | Disposition: A | Discharge status: Home / Self Care | Source: Ambulatory Visit | Attending: Obstetrics and Gynecology | Admitting: Obstetrics and Gynecology

## 2024-02-28 DIAGNOSIS — N644 Mastodynia: Secondary | ICD-10-CM | POA: Insufficient documentation

## 2024-04-10 ENCOUNTER — Other Ambulatory Visit: Payer: Self-pay | Admitting: Dermatology

## 2024-04-10 DIAGNOSIS — L649 Androgenic alopecia, unspecified: Secondary | ICD-10-CM

## 2024-06-30 ENCOUNTER — Other Ambulatory Visit (INDEPENDENT_AMBULATORY_CARE_PROVIDER_SITE_OTHER): Payer: Self-pay | Admitting: Vascular Surgery

## 2024-07-12 ENCOUNTER — Encounter: Payer: Self-pay | Admitting: Dermatology

## 2024-07-12 ENCOUNTER — Ambulatory Visit: Payer: BC Managed Care – PPO | Admitting: Dermatology

## 2024-07-12 DIAGNOSIS — L814 Other melanin hyperpigmentation: Secondary | ICD-10-CM

## 2024-07-12 DIAGNOSIS — Z1283 Encounter for screening for malignant neoplasm of skin: Secondary | ICD-10-CM

## 2024-07-12 DIAGNOSIS — L820 Inflamed seborrheic keratosis: Secondary | ICD-10-CM

## 2024-07-12 DIAGNOSIS — D044 Carcinoma in situ of skin of scalp and neck: Secondary | ICD-10-CM | POA: Diagnosis not present

## 2024-07-12 DIAGNOSIS — L578 Other skin changes due to chronic exposure to nonionizing radiation: Secondary | ICD-10-CM | POA: Diagnosis not present

## 2024-07-12 DIAGNOSIS — D489 Neoplasm of uncertain behavior, unspecified: Secondary | ICD-10-CM | POA: Diagnosis not present

## 2024-07-12 DIAGNOSIS — L649 Androgenic alopecia, unspecified: Secondary | ICD-10-CM

## 2024-07-12 DIAGNOSIS — Z79899 Other long term (current) drug therapy: Secondary | ICD-10-CM

## 2024-07-12 DIAGNOSIS — W908XXA Exposure to other nonionizing radiation, initial encounter: Secondary | ICD-10-CM | POA: Diagnosis not present

## 2024-07-12 DIAGNOSIS — L719 Rosacea, unspecified: Secondary | ICD-10-CM

## 2024-07-12 DIAGNOSIS — D099 Carcinoma in situ, unspecified: Secondary | ICD-10-CM

## 2024-07-12 DIAGNOSIS — D692 Other nonthrombocytopenic purpura: Secondary | ICD-10-CM

## 2024-07-12 DIAGNOSIS — Z7189 Other specified counseling: Secondary | ICD-10-CM

## 2024-07-12 DIAGNOSIS — L570 Actinic keratosis: Secondary | ICD-10-CM

## 2024-07-12 DIAGNOSIS — D229 Melanocytic nevi, unspecified: Secondary | ICD-10-CM

## 2024-07-12 HISTORY — DX: Carcinoma in situ, unspecified: D09.9

## 2024-07-12 MED ORDER — MINOXIDIL 2.5 MG PO TABS: 2.5000 mg | ORAL_TABLET | Freq: Every day | ORAL | 3 refills | Status: AC

## 2024-07-12 NOTE — Patient Instructions (Addendum)
 Biopsy Wound Care Instructions  Leave the original bandage on for 24 hours if possible.  If the bandage becomes soaked or soiled before that time, it is OK to remove it and examine the wound.  A small amount of post-operative bleeding is normal.  If excessive bleeding occurs, remove the bandage, place gauze over the site and apply continuous pressure (no peeking) over the area for 30 minutes. If this does not work, please call our clinic as soon as possible or page your doctor if it is after hours.   Once a day, cleanse the wound with soap and water. It is fine to shower. If a thick crust develops you may use a Q-tip dipped into dilute hydrogen peroxide (mix 1:1 with water) to dissolve it.  Hydrogen peroxide can slow the healing process, so use it only as needed.    After washing, apply petroleum jelly (Vaseline) or an antibiotic ointment if your doctor prescribed one for you, followed by a bandage.    For best healing, the wound should be covered with a layer of ointment at all times. If you are not able to keep the area covered with a bandage to hold the ointment in place, this may mean re-applying the ointment several times a day.  Continue this wound care until the wound has healed and is no longer open.   Itching and mild discomfort is normal during the healing process. However, if you develop pain or severe itching, please call our office.   If you have any discomfort, you can take Tylenol  (acetaminophen ) or ibuprofen  as directed on the bottle. (Please do not take these if you have an allergy to them or cannot take them for another reason).  Some redness, tenderness and white or yellow material in the wound is normal healing.  If the area becomes very sore and red, or develops a thick yellow-green material (pus), it may be infected; please notify us .    If you have stitches, return to clinic as directed to have the stitches removed. You will continue wound care for 2-3 days after the stitches  are removed.   Wound healing continues for up to one year following surgery. It is not unusual to experience pain in the scar from time to time during the interval.  If the pain becomes severe or the scar thickens, you should notify the office.    A slight amount of redness in a scar is expected for the first six months.  After six months, the redness will fade and the scar will soften and fade.  The color difference becomes less noticeable with time.  If there are any problems, return for a post-op surgery check at your earliest convenience.  To improve the appearance of the scar, you can use silicone scar gel, cream, or sheets (such as Mederma or Serica) every night for up to one year. These are available over the counter (without a prescription).  Please call our office at [phone] for any questions or concerns.      Seborrheic Keratosis  What causes seborrheic keratoses? Seborrheic keratoses are harmless, common skin growths that first appear during adult life.  As time goes by, more growths appear.  Some people may develop a large number of them.  Seborrheic keratoses appear on both covered and uncovered body parts.  They are not caused by sunlight.  The tendency to develop seborrheic keratoses can be inherited.  They vary in color from skin-colored to gray, brown, or even black.  They can be either smooth or have a rough, warty surface.   Seborrheic keratoses are superficial and look as if they were stuck on the skin.  Under the microscope this type of keratosis looks like layers upon layers of skin.  That is why at times the top layer may seem to fall off, but the rest of the growth remains and re-grows.    Treatment Seborrheic keratoses do not need to be treated, but can easily be removed in the office.  Seborrheic keratoses often cause symptoms when they rub on clothing or jewelry.  Lesions can be in the way of shaving.  If they become inflamed, they can cause itching, soreness, or  burning.  Removal of a seborrheic keratosis can be accomplished by freezing, burning, or surgery. If any spot bleeds, scabs, or grows rapidly, please return to have it checked, as these can be an indication of a skin cancer.  Cryotherapy Aftercare  Wash gently with soap and water everyday.   Apply Vaseline and Band-Aid daily until healed.   Actinic keratoses are precancerous spots that appear secondary to cumulative UV radiation exposure/sun exposure over time. They are chronic with expected duration over 1 year. A portion of actinic keratoses will progress to squamous cell carcinoma of the skin. It is not possible to reliably predict which spots will progress to skin cancer and so treatment is recommended to prevent development of skin cancer.  Recommend daily broad spectrum sunscreen SPF 30+ to sun-exposed areas, reapply every 2 hours as needed.  Recommend staying in the shade or wearing long sleeves, sun glasses (UVA+UVB protection) and wide brim hats (4-inch brim around the entire circumference of the hat). Call for new or changing lesions.   Melanoma ABCDEs  Melanoma is the most dangerous type of skin cancer, and is the leading cause of death from skin disease.  You are more likely to develop melanoma if you: Have light-colored skin, light-colored eyes, or red or blond hair Spend a lot of time in the sun Tan regularly, either outdoors or in a tanning bed Have had blistering sunburns, especially during childhood Have a close family member who has had a melanoma Have atypical moles or large birthmarks  Early detection of melanoma is key since treatment is typically straightforward and cure rates are extremely high if we catch it early.   The first sign of melanoma is often a change in a mole or a new dark spot.  The ABCDE system is a way of remembering the signs of melanoma.  A for asymmetry:  The two halves do not match. B for border:  The edges of the growth are irregular. C for  color:  A mixture of colors are present instead of an even brown color. D for diameter:  Melanomas are usually (but not always) greater than 6mm - the size of a pencil eraser. E for evolution:  The spot keeps changing in size, shape, and color.  Please check your skin once per month between visits. You can use a small mirror in front and a large mirror behind you to keep an eye on the back side or your body.   If you see any new or changing lesions before your next follow-up, please call to schedule a visit.  Please continue daily skin protection including broad spectrum sunscreen SPF 30+ to sun-exposed areas, reapplying every 2 hours as needed when you're outdoors.   Staying in the shade or wearing long sleeves, sun glasses (UVA+UVB protection) and wide  brim hats (4-inch brim around the entire circumference of the hat) are also recommended for sun protection.    Due to recent changes in healthcare laws, you may see results of your pathology and/or laboratory studies on MyChart before the doctors have had a chance to review them. We understand that in some cases there may be results that are confusing or concerning to you. Please understand that not all results are received at the same time and often the doctors may need to interpret multiple results in order to provide you with the best plan of care or course of treatment. Therefore, we ask that you please give us  2 business days to thoroughly review all your results before contacting the office for clarification. Should we see a critical lab result, you will be contacted sooner.   If You Need Anything After Your Visit  If you have any questions or concerns for your doctor, please call our main line at [phone] and press option 4 to reach your doctor's medical assistant. If no one answers, please leave a voicemail as directed and we will return your call as soon as possible. Messages left after 4 pm will be answered the following business day.    You may also send us  a message via MyChart. We typically respond to MyChart messages within 1-2 business days.  For prescription refills, please ask your pharmacy to contact our office. Our fax number is [phone].  If you have an urgent issue when the clinic is closed that cannot wait until the next business day, you can page your doctor at the number below.    Please note that while we do our best to be available for urgent issues outside of office hours, we are not available 24/7.   If you have an urgent issue and are unable to reach us , you may choose to seek medical care at your doctor's office, retail clinic, urgent care center, or emergency room.  If you have a medical emergency, please immediately call 911 or go to the emergency department.  Pager Numbers  - Dr. Hester: [phone]  - Dr. Jackquline: [phone]  - Dr. Claudene: [phone]   - Dr. Raymund: [phone]  In the event of inclement weather, please call our main line at [phone] for an update on the status of any delays or closures.  Dermatology Medication Tips: Please keep the boxes that topical medications come in in order to help keep track of the instructions about where and how to use these. Pharmacies typically print the medication instructions only on the boxes and not directly on the medication tubes.   If your medication is too expensive, please contact our office at [phone] option 4 or send us  a message through MyChart.   We are unable to tell what your co-pay for medications will be in advance as this is different depending on your insurance coverage. However, we may be able to find a substitute medication at lower cost or fill out paperwork to get insurance to cover a needed medication.   If a prior authorization is required to get your medication covered by your insurance company, please allow us  1-2 business days to complete this process.  Drug prices often vary depending on where the  prescription is filled and some pharmacies may offer cheaper prices.  The website www.goodrx.com contains coupons for medications through different pharmacies. The prices here do not account for what the cost may be with help from insurance (it may be cheaper with your insurance),  but the website can give you the price if you did not use any insurance.  - You can print the associated coupon and take it with your prescription to the pharmacy.  - You may also stop by our office during regular business hours and pick up a GoodRx coupon card.  - If you need your prescription sent electronically to a different pharmacy, notify our office through [facility] or by phone at [phone] option 4.     Si Usted Necesita Algo Despus de Su Visita  Tambin puede enviarnos un mensaje a travs de Clinical Cytogeneticist. Por lo general respondemos a los mensajes de MyChart en el transcurso de 1 a 2 das hbiles.  Para renovar recetas, por favor pida a su farmacia que se ponga en contacto con nuestra oficina. Randi lakes de fax es [place] [phone].  Si tiene un asunto urgente cuando la clnica est cerrada y que no puede esperar hasta el siguiente da hbil, puede llamar/localizar a su doctor(a) al nmero que aparece a continuacin.   Por favor, tenga en cuenta que aunque hacemos todo lo posible para estar disponibles para asuntos urgentes fuera del horario de [place], no estamos disponibles las 24 horas del da, los 7 [address]  [address] de la [place].   Si tiene un problema urgente y no puede comunicarse con nosotros, puede optar por buscar atencin mdica  en el consultorio de su doctor(a), en una clnica privada, en un centro de atencin urgente o en una sala de emergencias.  Si tiene engineer, drilling, por favor llame inmediatamente al 911 o vaya a la sala de emergencias.  Nmeros de bper  - Dr. Hester: [phone]  - Dra. Jackquline: [phone]  - Dr. Claudene: [phone]  - Dra. Kitts: [phone]  En  caso de inclemencias del [place], por favor llame a nuestra lnea principal al [phone] para una actualizacin sobre el estado de cualquier retraso o cierre.  Consejos para la medicacin en dermatologa: Por favor, guarde las cajas en las que vienen los medicamentos de uso tpico para ayudarle a seguir las instrucciones sobre dnde y cmo usarlos. Las farmacias generalmente imprimen las instrucciones del medicamento slo en las cajas y no directamente en los tubos del [place].   Si su medicamento es muy caro, por favor, pngase en contacto con landry rieger llamando al [phone] y presione la opcin 4 o envenos un mensaje a travs de Clinical Cytogeneticist.   No podemos decirle cul ser su copago por los medicamentos por adelantado ya que esto es diferente dependiendo de la cobertura de su seguro. Sin embargo, es posible que podamos encontrar un medicamento sustituto a audiological scientist un formulario para que el seguro cubra el medicamento que se considera necesario.   Si se requiere una autorizacin previa para que su compaa de seguros cubra su medicamento, por favor permtanos de 1 a 2 das hbiles para completar este proceso.  Los precios de los medicamentos varan con frecuencia dependiendo del environmental consultant de dnde se surte la receta y alguna farmacias pueden ofrecer precios ms baratos.  El sitio web www.goodrx.com tiene cupones para medicamentos de health and safety inspector. Los precios aqu no tienen en cuenta lo que podra costar con la ayuda del seguro (puede ser ms barato con su seguro), pero el sitio web puede darle el precio si no utiliz tourist information centre manager.  - Puede imprimir el cupn correspondiente y llevarlo con su receta a la farmacia.  - Tambin puede pasar por nuestra oficina durante el horario de atencin regular y education officer, museum  una tarjeta de cupones de GoodRx.  - Si necesita que su receta se enve electrnicamente a una farmacia diferente, informe a nuestra oficina a travs de MyChart de [facility] o por telfono llamando al [phone] y presione la opcin 4.

## 2024-07-12 NOTE — Progress Notes (Signed)
 Follow-Up Visit   Subjective  Amy Hammond is a 63 y.o. female who presents for the following: Skin Cancer Screening and Full Body Skin Exam Hx of isks Hx of rosacea  Hx of androgenetic alopecia Place behind right ear Scaly spot at left nose  Red spot at right forearm   The patient presents for Total-Body Skin Exam (TBSE) for skin cancer screening and mole check. The patient has spots, moles and lesions to be evaluated, some may be new or changing and the patient may have concern these could be cancer.  The following portions of the chart were reviewed this encounter and updated as appropriate: medications, allergies, medical history  Review of Systems:  No other skin or systemic complaints except as noted in HPI or Assessment and Plan.  Objective  Well appearing patient in no apparent distress; mood and affect are within normal limits.  A full examination was performed including scalp, head, eyes, ears, nose, lips, neck, chest, axillae, abdomen, back, buttocks, bilateral upper extremities, bilateral lower extremities, hands, feet, fingers, toes, fingernails, and toenails. All findings within normal limits unless otherwise noted below.   Relevant physical exam findings are noted in the Assessment and Plan.  left nose supertip x 1, left upper lip x 1 (2) Erythematous thin papules/macules with gritty scale.  right forehead x 1, right nose x 1 (2) Erythematous stuck-on, waxy papule or plaque right neck postauricular 1.7 cm pink patch with central scar    Assessment & Plan   SKIN CANCER SCREENING PERFORMED TODAY.  ACTINIC DAMAGE - Chronic condition, secondary to cumulative UV/sun exposure - diffuse scaly erythematous macules with underlying dyspigmentation - Recommend daily broad spectrum sunscreen SPF 30+ to sun-exposed areas, reapply every 2 hours as needed.  - Staying in the shade or wearing long sleeves, sun glasses (UVA+UVB protection) and wide brim hats (4-inch brim  around the entire circumference of the hat) are also recommended for sun protection.  - Call for new or changing lesions.  LENTIGINES, SEBORRHEIC KERATOSES, HEMANGIOMAS - Benign normal skin lesions - Benign-appearing - Call for any changes  MELANOCYTIC NEVI - Tan-brown and/or pink-flesh-colored symmetric macules and papules - Benign appearing on exam today - Observation - Call clinic for new or changing moles - Recommend daily use of broad spectrum spf 30+ sunscreen to sun-exposed areas.   Purpura - Chronic; persistent and recurrent.  Treatable, but not curable. - Violaceous macules and patches - Benign - Related to trauma, age, sun damage and/or use of blood thinners, chronic use of topical and/or oral steroids - Observe - Can use OTC arnica containing moisturizer such as Dermend Bruise Formula if desired - Call for worsening or other concerns  ROSACEA  Exam:  Mild erythema. Chronic condition with duration or expected duration over one year. Currently well-controlled. Rosacea is a chronic progressive skin condition usually affecting the face of adults, causing redness and/or acne bumps. It is treatable but not curable. It sometimes affects the eyes (ocular rosacea) as well. It may respond to topical and/or systemic medication and can flare with stress, sun exposure, alcohol, exercise, topical steroids (including hydrocortisone/cortisone 10) and some foods.  Daily application of broad spectrum spf 30+ sunscreen to face is recommended to reduce flares. Treatment Plan Continue Skin Medicinals metronidazole/ivermectin/azelaic acid cream Apply to face twice daily 1 yr Rf. Long term medication management.  Patient is using long term (months to years) prescription medication  to control their dermatologic condition.  These medications require periodic monitoring to evaluate for  efficacy and side effects.    ANDROGENETIC ALOPECIA (FEMALE PATTERN HAIR LOSS) Exam: Diffuse thinning of the crown  and widening of the midline part with retention of the frontal hairline Chronic and persistent condition with duration or expected duration over one year. Condition is symptomatic/ bothersome to patient. Not currently at goal. Female Androgenic Alopecia is a chronic condition related to genetics and/or hormonal changes.  In women androgenetic alopecia is commonly associated with menopause but may occur any time after puberty.  It causes hair thinning primarily on the crown with widening of the part and temporal hairline recession.  Can use OTC Rogaine  (minoxidil ) 5% solution/foam as directed.  Oral treatments in female patients who have no contraindication may include : - Low dose oral minoxidil  1.25 - 5mg  daily - Spironolactone 50 - 100mg  bid - Finasteride 2.5 - 5 mg daily Adjunctive therapies include: - Low Level Laser Light Therapy (LLLT) - Platelet-rich plasma injections (PRP) - Hair Transplants or scalp reduction  Treatment Plan: Continue Minoxidil  2.5 mg take 1 po daily dsp #90 3 Recommend OTC biotin supplement 2.5 -5 mg daily. Biotin does not promote hair growth except in the setting of underlying biotin deficiency.  Sometimes biotin can cause false results in thyroid function tests, so let your doctor know if you are taking it on a regular basis.   Doses of minoxidil  for hair loss are considered 'low dose'. This is because the doses used for hair loss are much lower than the doses which are used for conditions such as high blood pressure (hypertension). The doses used for hypertension are 10-40mg  per day.  Side effects are uncommon at the low doses (up to 2.5 mg/day) used to treat hair loss. Potential side effects, more commonly seen at higher doses, include: Increase in hair growth (hypertrichosis) elsewhere on face and body Temporary hair shedding upon starting medication which may last up to 4 weeks Ankle swelling, fluid retention, rapid weight gain more than 5 pounds Low blood pressure  and feeling lightheaded or dizzy when standing up quickly Fast or irregular heartbeat Headaches Long term medication management.  Patient is using long term (months to years) prescription medication  to control their dermatologic condition.  These medications require periodic monitoring to evaluate for efficacy and side effects and may require periodic laboratory monitoring.   ANDROGENETIC ALOPECIA   Related Medications minoxidil  (LONITEN ) 2.5 MG tablet Take 1 tablet (2.5 mg total) by mouth daily. ACTINIC KERATOSIS (2) left nose supertip x 1, left upper lip x 1 (2) Actinic keratoses are precancerous spots that appear secondary to cumulative UV radiation exposure/sun exposure over time. They are chronic with expected duration over 1 year. A portion of actinic keratoses will progress to squamous cell carcinoma of the skin. It is not possible to reliably predict which spots will progress to skin cancer and so treatment is recommended to prevent development of skin cancer.  Recommend daily broad spectrum sunscreen SPF 30+ to sun-exposed areas, reapply every 2 hours as needed.  Recommend staying in the shade or wearing long sleeves, sun glasses (UVA+UVB protection) and wide brim hats (4-inch brim around the entire circumference of the hat). Call for new or changing lesions. Destruction of lesion - left nose supertip x 1, left upper lip x 1 (2) Complexity: simple   Destruction method: cryotherapy   Informed consent: discussed and consent obtained   Timeout:  patient name, date of birth, surgical site, and procedure verified Lesion destroyed using liquid nitrogen: Yes   Region frozen  until ice ball extended beyond lesion: Yes   Outcome: patient tolerated procedure well with no complications   Post-procedure details: wound care instructions given    INFLAMED SEBORRHEIC KERATOSIS (2) right forehead x 1, right nose x 1 (2) Symptomatic, irritating, patient would like treated. Destruction of  lesion - right forehead x 1, right nose x 1 (2) Complexity: simple   Destruction method: cryotherapy   Informed consent: discussed and consent obtained   Timeout:  patient name, date of birth, surgical site, and procedure verified Lesion destroyed using liquid nitrogen: Yes   Region frozen until ice ball extended beyond lesion: Yes   Outcome: patient tolerated procedure well with no complications   Post-procedure details: wound care instructions given    NEOPLASM OF UNCERTAIN BEHAVIOR right neck postauricular Skin / nail biopsy Type of biopsy: tangential   Informed consent: discussed and consent obtained   Timeout: patient name, date of birth, surgical site, and procedure verified   Procedure prep:  Patient was prepped and draped in usual sterile fashion Prep type:  Isopropyl alcohol Anesthesia: the lesion was anesthetized in a standard fashion   Anesthetic:  1% lidocaine  w/ epinephrine  1-100,000 buffered w/ 8.4% NaHCO3 Instrument used: flexible razor blade   Hemostasis achieved with: pressure, aluminum chloride and electrodesiccation   Outcome: patient tolerated procedure well   Post-procedure details: sterile dressing applied and wound care instructions given   Dressing type: petrolatum and bandage    Specimen 1 - Surgical pathology Differential Diagnosis: r/o ca   Check Margins: No R/o ca  Return for 6 month tbse.  IEleanor Blush, CMA, am acting as scribe for Alm Rhyme, MD.   Documentation: I have reviewed the above documentation for accuracy and completeness, and I agree with the above.  Alm Rhyme, MD

## 2024-07-16 LAB — SURGICAL PATHOLOGY

## 2024-07-17 ENCOUNTER — Ambulatory Visit: Payer: Self-pay | Admitting: Dermatology

## 2024-07-17 ENCOUNTER — Encounter: Payer: Self-pay | Admitting: Dermatology

## 2024-07-17 NOTE — Telephone Encounter (Signed)
 Advised pt of bx results.  Advised pt Dr. Hester would like to txt with LN2 then a topical chemotherapy cream.  Scheduled pt for f/u./sh

## 2024-07-17 NOTE — Telephone Encounter (Signed)
-----   Message from Amy Hammond sent at 07/17/2024 11:35 AM EST ----- FINAL DIAGNOSIS        1. 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

## 2024-08-09 ENCOUNTER — Encounter: Payer: Self-pay | Admitting: Dermatology

## 2024-08-09 ENCOUNTER — Ambulatory Visit (INDEPENDENT_AMBULATORY_CARE_PROVIDER_SITE_OTHER): Admitting: Dermatology

## 2024-08-09 DIAGNOSIS — D044 Carcinoma in situ of skin of scalp and neck: Secondary | ICD-10-CM

## 2024-08-09 DIAGNOSIS — Z7189 Other specified counseling: Secondary | ICD-10-CM

## 2024-08-09 DIAGNOSIS — Z79899 Other long term (current) drug therapy: Secondary | ICD-10-CM

## 2024-08-09 DIAGNOSIS — Z5111 Encounter for antineoplastic chemotherapy: Secondary | ICD-10-CM

## 2024-08-09 DIAGNOSIS — D099 Carcinoma in situ, unspecified: Secondary | ICD-10-CM

## 2024-08-09 MED ORDER — MUPIROCIN 2 % EX OINT: TOPICAL_OINTMENT | CUTANEOUS | 0 refills | Status: AC

## 2024-08-09 NOTE — Patient Instructions (Addendum)
 Cryotherapy Aftercare  Wash gently with soap and water everyday.   Apply Vaseline and Band-Aid daily until healed.     Instructions for [facility] Medications  One or more of your medications was sent to the [facility] mail order compounding pharmacy. You will receive an email from them and can purchase the medicine through that link. It will then be mailed to your home at the address you confirmed. If for any reason you do not receive an email from them, please check your spam folder. If you still do not find the email, please let us  know. [facility] phone number is [phone].     5-Fluorouracil/Calcipotriene Patient Education  Start January 31 apply to right post auricular twice a day for 10 days  Actinic keratoses are the dry, red scaly spots on the skin caused by sun damage. A portion of these spots can turn into skin cancer with time, and treating them can help prevent development of skin cancer.   Treatment of these spots requires removal of the defective skin cells. There are various ways to remove actinic keratoses, including freezing with liquid nitrogen, treatment with creams, or treatment with a blue light procedure in the office.   5-fluorouracil cream is a topical cream used to treat actinic keratoses. It works by interfering with the growth of abnormal fast-growing skin cells, such as actinic keratoses. These cells peel off and are replaced by healthy ones.   5-fluorouracil/calcipotriene is a combination of the 5-fluorouracil cream with a vitamin D analog cream called calcipotriene. The calcipotriene alone does not treat actinic keratoses. However, when it is combined with 5-fluorouracil, it helps the 5-fluorouracil treat the actinic keratoses much faster so that the same results can be achieved with a much shorter treatment time.  INSTRUCTIONS FOR 5-FLUOROURACIL/CALCIPOTRIENE CREAM:   5-fluorouracil/calcipotriene cream typically only needs to be used for 4-7  days. A thin layer should be applied twice a day to the treatment areas recommended by your physician.   If your physician prescribed you separate tubes of 5-fluourouracil and calcipotriene, apply a thin layer of 5-fluorouracil followed by a thin layer of calcipotriene.   Avoid contact with your eyes, nostrils, and mouth. Do not use 5-fluorouracil/calcipotriene cream on infected or open wounds.   You will develop redness, irritation and some crusting at areas where you have pre-cancer damage/actinic keratoses. IF YOU DEVELOP PAIN, BLEEDING, OR SIGNIFICANT CRUSTING, STOP THE TREATMENT EARLY - you have already gotten a good response and the actinic keratoses should clear up well.  Wash your hands after applying 5-fluorouracil 5% cream on your skin.   A moisturizer or sunscreen with a minimum SPF 30 should be applied each morning.   Once you have finished the treatment, you can apply a thin layer of Vaseline twice a day to irritated areas to soothe and calm the areas more quickly. If you experience significant discomfort, contact your physician.  For some patients it is necessary to repeat the treatment for best results.  SIDE EFFECTS: When using 5-fluorouracil/calcipotriene cream, you may have mild irritation, such as redness, dryness, swelling, or a mild burning sensation. This usually resolves within 2 weeks. The more actinic keratoses you have, the more redness and inflammation you can expect during treatment. Eye irritation has been reported rarely. If this occurs, please let us  know.  If you have any trouble using this cream, please call the office. If you have any other questions about this information, please do not hesitate to ask me before you leave  the office.   Due to recent changes in healthcare laws, you may see results of your pathology and/or laboratory studies on MyChart before the doctors have had a chance to review them. We understand that in some cases there may be results that  are confusing or concerning to you. Please understand that not all results are received at the same time and often the doctors may need to interpret multiple results in order to provide you with the best plan of care or course of treatment. Therefore, we ask that you please give us  2 business days to thoroughly review all your results before contacting the office for clarification. Should we see a critical lab result, you will be contacted sooner.   If You Need Anything After Your Visit  If you have any questions or concerns for your doctor, please call our main line at [phone] and press option 4 to reach your doctor's medical assistant. If no one answers, please leave a voicemail as directed and we will return your call as soon as possible. Messages left after 4 pm will be answered the following business day.   You may also send us  a message via MyChart. We typically respond to MyChart messages within 1-2 business days.  For prescription refills, please ask your pharmacy to contact our office. Our fax number is [phone].  If you have an urgent issue when the clinic is closed that cannot wait until the next business day, you can page your doctor at the number below.    Please note that while we do our best to be available for urgent issues outside of office hours, we are not available 24/7.   If you have an urgent issue and are unable to reach us , you may choose to seek medical care at your doctor's office, retail clinic, urgent care center, or emergency room.  If you have a medical emergency, please immediately call 911 or go to the emergency department.  Pager Numbers  - Dr. Hester: [phone]  - Dr. Jackquline: [phone]  - Dr. Claudene: [phone]   - Dr. Raymund: [phone]  In the event of inclement weather, please call our main line at [phone] for an update on the status of any delays or closures.  Dermatology Medication Tips: Please keep the boxes that topical  medications come in in order to help keep track of the instructions about where and how to use these. Pharmacies typically print the medication instructions only on the boxes and not directly on the medication tubes.   If your medication is too expensive, please contact our office at [phone] option 4 or send us  a message through MyChart.   We are unable to tell what your co-pay for medications will be in advance as this is different depending on your insurance coverage. However, we may be able to find a substitute medication at lower cost or fill out paperwork to get insurance to cover a needed medication.   If a prior authorization is required to get your medication covered by your insurance company, please allow us  1-2 business days to complete this process.  Drug prices often vary depending on where the prescription is filled and some pharmacies may offer cheaper prices.  The website www.goodrx.com contains coupons for medications through different pharmacies. The prices here do not account for what the cost may be with help from insurance (it may be cheaper with your insurance), but the website can give you the price if you did not use any insurance.  -  You can print the associated coupon and take it with your prescription to the pharmacy.  - You may also stop by our office during regular business hours and pick up a GoodRx coupon card.  - If you need your prescription sent electronically to a different pharmacy, notify our office through [facility] or by phone at [phone] option 4.     Si Usted Necesita Algo Despus de Su Visita  Tambin puede enviarnos un mensaje a travs de Clinical Cytogeneticist. Por lo general respondemos a los mensajes de MyChart en el transcurso de 1 a 2 das hbiles.  Para renovar recetas, por favor pida a su farmacia que se ponga en contacto con nuestra oficina. Randi lakes de fax es [place] [phone].  Si tiene un asunto urgente cuando la clnica est  cerrada y que no puede esperar hasta el siguiente da hbil, puede llamar/localizar a su doctor(a) al nmero que aparece a continuacin.   Por favor, tenga en cuenta que aunque hacemos todo lo posible para estar disponibles para asuntos urgentes fuera del horario de [place], no estamos disponibles las 24 horas del da, los 7 [address] de la [place].   Si tiene un problema urgente y no puede comunicarse con nosotros, puede optar por buscar atencin mdica  en el consultorio de su doctor(a), en una clnica privada, en un centro de atencin urgente o en una sala de emergencias.  Si tiene engineer, drilling, por favor llame inmediatamente al 911 o vaya a la sala de emergencias.  Nmeros de bper  - Dr. Hester: [phone]  - Dra. Jackquline: [phone]  - Dr. Claudene: [phone]  - Dra. Kitts: [phone]  En caso de inclemencias del [place], por favor llame a nuestra lnea principal al [phone] para una actualizacin sobre el estado de cualquier retraso o cierre.  Consejos para la medicacin en dermatologa: Por favor, guarde las cajas en las que vienen los medicamentos de uso tpico para ayudarle a seguir las instrucciones sobre dnde y cmo usarlos. Las farmacias generalmente imprimen las instrucciones del medicamento slo en las cajas y no directamente en los tubos del [place].   Si su medicamento es muy caro, por favor, pngase en contacto con landry rieger llamando al [phone] y presione la opcin 4 o envenos un mensaje a travs de Clinical Cytogeneticist.   No podemos decirle cul ser su copago por los medicamentos por adelantado ya que esto es diferente dependiendo de la cobertura de su seguro. Sin embargo, es posible que podamos encontrar un medicamento sustituto a audiological scientist un formulario para que el seguro cubra el medicamento que se considera necesario.   Si se requiere una autorizacin previa para que su compaa de seguros cubra su medicamento, por favor permtanos de 1  a 2 das hbiles para completar este proceso.  Los precios de los medicamentos varan con frecuencia dependiendo del environmental consultant de dnde se surte la receta y alguna farmacias pueden ofrecer precios ms baratos.  El sitio web www.goodrx.com tiene cupones para medicamentos de health and safety inspector. Los precios aqu no tienen en cuenta lo que podra costar con la ayuda del seguro (puede ser ms barato con su seguro), pero el sitio web puede darle el precio si no utiliz tourist information centre manager.  - Puede imprimir el cupn correspondiente y llevarlo con su receta a la farmacia.  - Tambin puede pasar por nuestra oficina durante el horario de atencin regular y education officer, museum una tarjeta de cupones de GoodRx.  - Si necesita que su receta se enve electrnicamente  a [place], informe a nuestra oficina a travs de MyChart de  o por telfono llamando al [phone] y presione la opcin 4.

## 2024-08-09 NOTE — Progress Notes (Signed)
° °  Follow-Up Visit   Subjective  Amy Hammond is a 63 y.o. 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  Objective  Well appearing patient in no apparent distress; mood and affect are within normal limits.  A focused examination was performed of the following areas: Face,neck   Relevant exam findings are noted in the Assessment and Plan. 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 Anesthesia: the lesion was anesthetized in a standard fashion   Anesthetic:  1% lidocaine  w/ epinephrine  1-100,000 local infiltration Final wound size (cm):  1.7 Hemostasis achieved with:  pressure Outcome: patient tolerated procedure well with no complications   Post-procedure details: wound care instructions given   Post-procedure details comment:  Ointment and small bandage applied  Start Mupirocin ointment apply to affected skin qd-bid  Start January 31 apply to right post auricular twice a day for 10 days  - Start 5-fluorouracil/calcipotriene cream twice a day for 10 days to affected areas including right neck postauricular. Prescription sent to [facility]. Patient advised they will receive an email to purchase the medication online and have it sent to their  home. Patient provided with handout reviewing treatment course and side effects and advised to call or message us  on MyChart with any concerns.  Reviewed course of treatment and expected reaction.  Patient advised to expect inflammation and crusting and advised that erosions are possible.  Patient advised to be diligent with sun protection during and after treatment. Counseled to keep medication out of reach of children and pets.    Return in about 3 months (around 11/07/2024) for recheck SCCis .  IFay Kirks, CMA, am acting as scribe for Alm Rhyme, MD .   Documentation: I have reviewed the above documentation for accuracy and completeness, and I agree with the above.  Alm Rhyme, MD

## 2024-08-09 NOTE — Progress Notes (Signed)
 MRN : [id number]  Amy Hammond is a 63 y.o. (05-27-1961) female who presents with chief complaint of check circulation.  History of Present Illness:   The patient returns to the office for follow-up regarding chronic mesenteric ischemia.   The patient is status post angiography with intervention of the celiac artery.   Procedure date February 08, 2023:   Percutaneous transluminal angioplasty and stent placement of the celiac artery  Selective injection of the SMA second-order catheter placement.    Today the patient is noting postprandial abdominal pain in the epigastric area.  This is happening on a consistent and regular basis.  She does substantiate food fear, particular foods do not seem to aggravate or alleviate the symptoms it seems to be anything she eats.  The patient denies bloody bowel movements or diarrhea.  She does continue to have some bloating which was improved initially after the procedure but it has returned.   No history of peptic ulcer disease.    She is also followed for leg swelling.  The swelling has persisted and the discomfort associated with swelling continues.  In particular she has been having increased pain in the right thigh that seems to radiate down to the calf.  It is located in the medial area of the leg.  There have not been any interval development of a ulcerations or wounds.   Since the previous visit the patient has been wearing graduated compression stockings and has noted little if any improvement in the lymphedema. The patient has been using compression routinely morning until night.   The patient also states elevation during the day and exercise is being done too.   The patient denies amaurosis fugax or recent TIA symptoms. There are no recent neurological changes noted. The patient denies claudication symptoms or rest pain symptoms. The patient denies history of DVT, PE or  superficial thrombophlebitis. The patient denies recent episodes of angina.   Previous duplex ultrasound of the venous system demonstrates a normal venous system no evidence of superficial reflux or deep venous reflux.   Duplex US  of the mesenteric arteries done today shows patent celiac artery stent.  There is now a significant stenosis of the SMA identified with velocities greater than 300 cm/s. A change compared to the previous study with worsening of the SMA.    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   right neck postauricular, treated with LN2 08/09/24 and topical chemo cream    Past Surgical History:  Procedure Laterality Date   ABDOMINAL HYSTERECTOMY     BILATERAL SALPINGECTOMY Bilateral 07/07/2015   Procedure: BILATERAL SALPINGECTOMY;  Surgeon: Debby JINNY Dinsmore, MD;  Location: [facility];  Service: Gynecology;  Laterality: Bilateral;   CHOLECYSTECTOMY     TUBAL LIGATION     VAGINAL HYSTERECTOMY N/A 07/07/2015   Procedure: HYSTERECTOMY VAGINAL;  Surgeon: Debby JINNY Dinsmore, MD;  Location: [facility];  Service: Gynecology;  Laterality:  N/A;   VISCERAL ANGIOGRAPHY N/A 02/08/2023   Procedure: VISCERAL ANGIOGRAPHY;  Surgeon: Jama Cordella MATSU, MD;  Location: [facility];  Service: Cardiovascular;  Laterality: N/A;    Social History Social History[2]  Family History Family History  Problem Relation Age of Onset   Heart attack Mother    Breast cancer Paternal Grandmother        great gm   Breast cancer Cousin 50       mat cousin   Kidney disease Neg Hx    Kidney cancer Neg Hx    Prostate cancer Neg Hx     Allergies[3]   REVIEW OF  SYSTEMS (Negative unless checked)  Constitutional: [] Weight loss  [] Fever  [] Chills Cardiac: [] Chest pain   [] Chest pressure   [] Palpitations   [] Shortness of breath when laying flat   [] Shortness of breath with exertion. Vascular:  [x] Pain in legs with walking   [] Pain in legs at rest  [] History of DVT   [] Phlebitis   [] Swelling in legs   [] Varicose veins   [] Non-healing ulcers Pulmonary:   [] Uses home oxygen   [] Productive cough   [] Hemoptysis   [] Wheeze  [] COPD   [] Asthma Neurologic:  [] Dizziness   [] Seizures   [] History of stroke   [] History of TIA  [] Aphasia   [] Vissual changes   [] Weakness or numbness in arm   [] Weakness or numbness in leg Musculoskeletal:   [] Joint swelling   [] Joint pain   [] Low back pain Hematologic:  [] Easy bruising  [] Easy bleeding   [] Hypercoagulable state   [] Anemic Gastrointestinal:  [] Diarrhea   [] Vomiting  [x] Gastroesophageal reflux/heartburn   [] Difficulty swallowing. Genitourinary:  [] Chronic kidney disease   [] Difficult urination  [] Frequent urination   [] Blood in urine Skin:  [] Rashes   [] Ulcers  Psychological:  [] History of anxiety   []  History of major depression.  Physical Examination  There were no vitals filed for this visit. There is no height or weight on file to calculate BMI. Gen: WD/WN, NAD Head: [place]/AT, No temporalis wasting.  Ear/Nose/Throat: Hearing grossly intact, nares w/o erythema or drainage Eyes: PER, EOMI, sclera nonicteric.  Neck: Supple, no masses.  No bruit or JVD.  Pulmonary:  Good air movement, no audible wheezing, no use of accessory muscles.  Cardiac: RRR, normal S1, S2, no Murmurs. Vascular: She does have a palpable tender area in the medial thigh on the right, no open wounds Vessel Right Left  Radial Palpable Palpable  Gastrointestinal: soft, non-distended. No guarding/no peritoneal signs.  Musculoskeletal: M/S 5/5 throughout.  No visible deformity.  Neurologic: CN 2-12 intact. Pain and light touch intact in extremities.   Symmetrical.  Speech is fluent. Motor exam as listed above. Psychiatric: Judgment intact, Mood & affect appropriate for pt's clinical situation. Dermatologic: No rashes or ulcers noted.  No changes consistent with cellulitis.   CBC Lab Results  Component Value Date   WBC 10.5 07/07/2015   HGB 12.8 07/07/2015   HCT 38.2 07/07/2015   MCV 94.8 07/07/2015   PLT 200 07/07/2015    BMET    Component Value Date/Time   NA 136 07/08/2015 0712   NA 142 09/03/2011 1543   K 3.6 07/08/2015 0712   K 3.3 (L) 09/03/2011 1543   CL 100 (L) 07/08/2015 0712   CL 106 09/03/2011 1543   CO2 29 07/08/2015 0712   CO2 25 09/03/2011 1543   GLUCOSE 86 07/08/2015 0712   GLUCOSE 93 09/03/2011 1543   BUN 14 02/08/2023 1122   BUN  11 09/03/2011 1543   CREATININE 1.06 (H) 02/08/2023 1122   CREATININE 0.76 09/03/2011 1543   CALCIUM  8.7 (L) 07/08/2015 0712   CALCIUM  8.8 09/03/2011 1543   GFRNONAA 59 (L) 02/08/2023 1122   GFRNONAA >60 09/03/2011 1543   GFRAA >60 07/08/2015 0712   GFRAA >60 09/03/2011 1543   CrCl cannot be calculated (Patient's most recent lab result is older than the maximum 21 days allowed.).  COAG No results found for: INR, PROTIME  Radiology No results found.   Assessment/Plan 1. Chronic mesenteric ischemia (Primary) Recommend:  The patient has evidence of severe atherosclerotic changes of the mesenteric arteries associated with weight loss as well as abdominal pain and N/V.  This represents a high risk for bowel infarction and death.  In particular it appears her SMA has progressed while her celiac appears to have been stable status post intervention  Patient should undergo angiography of the mesenteric arteries with the hope for intervention to eliminate the ischemic symptoms.    The risks and benefits as well as the alternative therapies was discussed in detail with the patient.  All questions were answered.  Patient agrees to proceed with angiography and  intervention.  The patient will follow up with me after the angiogram.  2. Celiac artery stenosis Recommend:  The patient has evidence of severe atherosclerotic changes of the mesenteric arteries associated with weight loss as well as abdominal pain and N/V.  This represents a high risk for bowel infarction and death.  In particular it appears her SMA has progressed while her celiac appears to have been stable status post intervention  Patient should undergo angiography of the mesenteric arteries with the hope for intervention to eliminate the ischemic symptoms.    The risks and benefits as well as the alternative therapies was discussed in detail with the patient.  All questions were answered.  Patient agrees to proceed with angiography and intervention.  The patient will follow up with me after the angiogram.  3. Pain and swelling of lower leg, right Recommend:  I have had a long discussion with the patient regarding swelling and why it  causes symptoms.  Patient will begin wearing graduated compression on a daily basis a prescription was given. The patient will  wear the stockings first thing in the morning and removing them in the evening. The patient is instructed specifically not to sleep in the stockings.   In addition, behavioral modification will be initiated.  This will include frequent elevation, use of over the counter pain medications and exercise such as walking.  Consideration for a lymph pump will also be made based upon the effectiveness of conservative therapy.  This would help to improve the edema control and prevent sequela such as ulcers and infections   Patient should undergo duplex ultrasound of the venous system to ensure that DVT, superficial phlebitis or reflux is not present.  The patient will follow-up with me after the ultrasound.  - VAS US  LOWER EXTREMITY VENOUS REFLUX; Future  4. Lymphedema Recommend:  I have had a long discussion with the patient  regarding swelling and why it  causes symptoms.  Patient will begin wearing graduated compression on a daily basis a prescription was given. The patient will  wear the stockings first thing in the morning and removing them in the evening. The patient is instructed specifically not to sleep in the stockings.   In addition, behavioral modification will be initiated.  This will include frequent elevation, use of over the  counter pain medications and exercise such as walking.  Consideration for a lymph pump will also be made based upon the effectiveness of conservative therapy.  This would help to improve the edema control and prevent sequela such as ulcers and infections   Patient should undergo duplex ultrasound of the venous system to ensure that DVT, superficial phlebitis or reflux is not present.  The patient will follow-up with me after the ultrasound.   5. Mixed hyperlipidemia Continue statin as ordered and reviewed, no changes at this time  6. Gastroesophageal reflux disease, unspecified whether esophagitis present Continue PPI as already ordered, this medication has been reviewed and there are no changes at this time.  Avoidence of caffeine and alcohol  Moderate elevation of the head of the bed   7. Chronic venous insufficiency Recommend:  I have had a long discussion with the patient regarding swelling and why it  causes symptoms.  Patient will begin wearing graduated compression on a daily basis a prescription was given. The patient will  wear the stockings first thing in the morning and removing them in the evening. The patient is instructed specifically not to sleep in the stockings.   In addition, behavioral modification will be initiated.  This will include frequent elevation, use of over the counter pain medications and exercise such as walking.  Consideration for a lymph pump will also be made based upon the effectiveness of conservative therapy.  This would help to improve  the edema control and prevent sequela such as ulcers and infections   Patient should undergo duplex ultrasound of the venous system to ensure that DVT, superficial phlebitis or reflux is not present.  The patient will follow-up with me after the ultrasound.    Cordella Shawl, MD  08/09/2024 12:40 PM      [1]  No outpatient medications have been marked as taking for the 08/13/24 encounter (Appointment) with Shawl, Cordella MATSU, MD.  [2]  Social History Tobacco Use   Smoking status: Former    Current packs/day: 0.00    Types: Cigarettes    Quit date: 06/30/2009    Years since quitting: 15.1   Smokeless tobacco: Never  Substance Use Topics   Alcohol use: No   Drug use: No  [3]  Allergies Allergen Reactions   Penicillins Anaphylaxis and Rash   Etodolac Other (See Comments)    Mouth sores   Macrobid [Nitrofurantoin Monohyd Macro] Itching   Meloxicam Itching   Premarin  [Conjugated Estrogens ] Itching   Adhesive [Tape] Rash    Paper tape ok to use.   Biaxin [Clarithromycin] Rash

## 2024-08-13 ENCOUNTER — Ambulatory Visit (INDEPENDENT_AMBULATORY_CARE_PROVIDER_SITE_OTHER)

## 2024-08-13 ENCOUNTER — Encounter (INDEPENDENT_AMBULATORY_CARE_PROVIDER_SITE_OTHER): Payer: Self-pay | Admitting: Vascular Surgery

## 2024-08-13 ENCOUNTER — Ambulatory Visit (INDEPENDENT_AMBULATORY_CARE_PROVIDER_SITE_OTHER): Admitting: Vascular Surgery

## 2024-08-13 ENCOUNTER — Other Ambulatory Visit (INDEPENDENT_AMBULATORY_CARE_PROVIDER_SITE_OTHER): Payer: Self-pay | Admitting: Vascular Surgery

## 2024-08-13 VITALS — BP 135/83 | HR 73 | Resp 18 | Ht 65.0 in | Wt 184.8 lb

## 2024-08-13 DIAGNOSIS — I872 Venous insufficiency (chronic) (peripheral): Secondary | ICD-10-CM | POA: Insufficient documentation

## 2024-08-13 DIAGNOSIS — M7989 Other specified soft tissue disorders: Secondary | ICD-10-CM | POA: Diagnosis not present

## 2024-08-13 DIAGNOSIS — E782 Mixed hyperlipidemia: Secondary | ICD-10-CM

## 2024-08-13 DIAGNOSIS — K551 Chronic vascular disorders of intestine: Secondary | ICD-10-CM | POA: Diagnosis not present

## 2024-08-13 DIAGNOSIS — M79661 Pain in right lower leg: Secondary | ICD-10-CM | POA: Insufficient documentation

## 2024-08-13 DIAGNOSIS — K219 Gastro-esophageal reflux disease without esophagitis: Secondary | ICD-10-CM

## 2024-08-13 DIAGNOSIS — I774 Celiac artery compression syndrome: Secondary | ICD-10-CM | POA: Diagnosis not present

## 2024-08-13 DIAGNOSIS — I890 Lymphedema, not elsewhere classified: Secondary | ICD-10-CM

## 2024-08-14 ENCOUNTER — Other Ambulatory Visit (INDEPENDENT_AMBULATORY_CARE_PROVIDER_SITE_OTHER)

## 2024-08-14 DIAGNOSIS — I872 Venous insufficiency (chronic) (peripheral): Secondary | ICD-10-CM

## 2024-08-27 ENCOUNTER — Telehealth (INDEPENDENT_AMBULATORY_CARE_PROVIDER_SITE_OTHER): Payer: Self-pay

## 2024-08-27 NOTE — Telephone Encounter (Signed)
 Spoke with the patient and she is scheduled with Dr. Jama on 09/18/24 with a 6:45 am arrival time for a SMA angio w/ stent at the [facility]. Pre-procedure instructions were discussed and will be sent to Mychart and mailed.

## 2024-09-18 ENCOUNTER — Encounter: Payer: Self-pay | Admitting: Vascular Surgery

## 2024-09-18 ENCOUNTER — Encounter
Admission: RE | Disposition: A | Discharge status: Home / Self Care | Payer: Self-pay | Source: Home / Self Care | Attending: Vascular Surgery

## 2024-09-18 ENCOUNTER — Ambulatory Visit
Admission: RE | Admit: 2024-09-18 | Discharge: 2024-09-18 | Disposition: A | Discharge status: Home / Self Care | Attending: Vascular Surgery | Admitting: Vascular Surgery

## 2024-09-18 ENCOUNTER — Other Ambulatory Visit: Payer: Self-pay

## 2024-09-18 DIAGNOSIS — M79661 Pain in right lower leg: Secondary | ICD-10-CM | POA: Insufficient documentation

## 2024-09-18 DIAGNOSIS — Z79899 Other long term (current) drug therapy: Secondary | ICD-10-CM | POA: Insufficient documentation

## 2024-09-18 DIAGNOSIS — K55059 Acute (reversible) ischemia of intestine, part and extent unspecified: Secondary | ICD-10-CM | POA: Diagnosis not present

## 2024-09-18 DIAGNOSIS — I890 Lymphedema, not elsewhere classified: Secondary | ICD-10-CM | POA: Diagnosis not present

## 2024-09-18 DIAGNOSIS — Z87891 Personal history of nicotine dependence: Secondary | ICD-10-CM | POA: Diagnosis not present

## 2024-09-18 DIAGNOSIS — I872 Venous insufficiency (chronic) (peripheral): Secondary | ICD-10-CM | POA: Diagnosis not present

## 2024-09-18 DIAGNOSIS — K219 Gastro-esophageal reflux disease without esophagitis: Secondary | ICD-10-CM | POA: Insufficient documentation

## 2024-09-18 DIAGNOSIS — T82858A Stenosis of vascular prosthetic devices, implants and grafts, initial encounter: Secondary | ICD-10-CM | POA: Diagnosis not present

## 2024-09-18 DIAGNOSIS — E782 Mixed hyperlipidemia: Secondary | ICD-10-CM | POA: Diagnosis not present

## 2024-09-18 DIAGNOSIS — K551 Chronic vascular disorders of intestine: Secondary | ICD-10-CM

## 2024-09-18 DIAGNOSIS — Z7902 Long term (current) use of antithrombotics/antiplatelets: Secondary | ICD-10-CM | POA: Insufficient documentation

## 2024-09-18 DIAGNOSIS — I774 Celiac artery compression syndrome: Secondary | ICD-10-CM | POA: Diagnosis not present

## 2024-09-18 HISTORY — PX: VISCERAL ANGIOGRAPHY: CATH118276

## 2024-09-18 LAB — BUN: BUN: 10 mg/dL (ref 8–23)

## 2024-09-18 LAB — CREATININE, SERUM
Creatinine, Ser: 0.85 mg/dL (ref 0.44–1.00)
GFR, Estimated: >60 mL/min

## 2024-09-18 MED ORDER — OXYCODONE HCL 5 MG PO TABS: 5.0000 mg | ORAL_TABLET | ORAL | Status: DC | PRN

## 2024-09-18 MED ORDER — SODIUM CHLORIDE 0.9 % IV SOLN: 250.0000 mL | INTRAVENOUS | Status: DC | PRN

## 2024-09-18 MED ORDER — VANCOMYCIN HCL IN DEXTROSE 1-5 GM/200ML-% IV SOLN
1000.0000 mg | INTRAVENOUS | Status: AC
  Administered 2024-09-18: 1000 mg via INTRAVENOUS

## 2024-09-18 MED ORDER — SODIUM CHLORIDE 0.9% FLUSH: 3.0000 mL | INTRAVENOUS | Status: DC | PRN

## 2024-09-18 MED ORDER — LIDOCAINE HCL (PF) 1 % IJ SOLN
INTRAMUSCULAR | Status: DC | PRN
  Administered 2024-09-18: 10 mL

## 2024-09-18 MED ORDER — HEPARIN SODIUM (PORCINE) 1000 UNIT/ML IJ SOLN
INTRAMUSCULAR | Status: AC
  Filled 2024-09-18: qty 10

## 2024-09-18 MED ORDER — SODIUM CHLORIDE 0.9 % IV SOLN: INTRAVENOUS | Status: DC

## 2024-09-18 MED ORDER — MIDAZOLAM HCL 2 MG/2ML IJ SOLN
INTRAMUSCULAR | Status: AC
  Filled 2024-09-18: qty 2

## 2024-09-18 MED ORDER — MORPHINE SULFATE (PF) 2 MG/ML IV SOLN: 2.0000 mg | INTRAVENOUS | Status: DC | PRN

## 2024-09-18 MED ORDER — FENTANYL CITRATE (PF) 100 MCG/2ML IJ SOLN
INTRAMUSCULAR | Status: DC | PRN
  Administered 2024-09-18: 50 ug via INTRAVENOUS
  Administered 2024-09-18 (×2): 25 ug via INTRAVENOUS

## 2024-09-18 MED ORDER — ONDANSETRON HCL 4 MG/2ML IJ SOLN: 4.0000 mg | Freq: Four times a day (QID) | INTRAMUSCULAR | Status: DC | PRN

## 2024-09-18 MED ORDER — FENTANYL CITRATE (PF) 100 MCG/2ML IJ SOLN
INTRAMUSCULAR | Status: AC
  Filled 2024-09-18: qty 2

## 2024-09-18 MED ORDER — HEPARIN (PORCINE) IN NACL 2000-0.9 UNIT/L-% IV SOLN
INTRAVENOUS | Status: DC | PRN
  Administered 2024-09-18: 1000 mL

## 2024-09-18 MED ORDER — ASPIRIN 81 MG PO TBEC: 81.0000 mg | DELAYED_RELEASE_TABLET | Freq: Every day | ORAL | 1 refills | Status: AC

## 2024-09-18 MED ORDER — MIDAZOLAM HCL (PF) 2 MG/2ML IJ SOLN
INTRAMUSCULAR | Status: DC | PRN
  Administered 2024-09-18 (×2): .5 mg via INTRAVENOUS
  Administered 2024-09-18: 2 mg via INTRAVENOUS

## 2024-09-18 MED ORDER — LABETALOL HCL 5 MG/ML IV SOLN: 10.0000 mg | INTRAVENOUS | Status: DC | PRN

## 2024-09-18 MED ORDER — HEPARIN SODIUM (PORCINE) 1000 UNIT/ML IJ SOLN
INTRAMUSCULAR | Status: DC | PRN
  Administered 2024-09-18: 6000 [IU] via INTRAVENOUS

## 2024-09-18 MED ORDER — ACETAMINOPHEN 325 MG PO TABS: 650.0000 mg | ORAL_TABLET | ORAL | Status: DC | PRN

## 2024-09-18 MED ORDER — SODIUM CHLORIDE 0.9% FLUSH: 3.0000 mL | Freq: Two times a day (BID) | INTRAVENOUS | Status: DC

## 2024-09-18 MED ORDER — HYDRALAZINE HCL 20 MG/ML IJ SOLN: 5.0000 mg | INTRAMUSCULAR | Status: DC | PRN

## 2024-09-18 MED ORDER — VANCOMYCIN HCL IN DEXTROSE 1-5 GM/200ML-% IV SOLN
INTRAVENOUS | Status: AC
  Filled 2024-09-18: qty 200

## 2024-09-18 MED ORDER — IODIXANOL 320 MG/ML IV SOLN
INTRAVENOUS | Status: DC | PRN
  Administered 2024-09-18: 50 mL

## 2024-09-18 NOTE — H&P (Signed)
 "                                                                      MRN : [id number]  Amy Hammond is a 64 y.o. (1961-03-28) female who presents with chief complaint of check circulation.  History of Present Illness:   The patient since to the [facility] for treatment regarding chronic mesenteric ischemia.   The patient is status post angiography with intervention of the celiac artery.   Procedure date February 08, 2023:   Percutaneous transluminal angioplasty and stent placement of the celiac artery  Selective injection of the SMA second-order catheter placement.    Today the patient is noting postprandial abdominal pain in the epigastric area.  This is happening on a consistent and regular basis.  She does substantiate food fear, particular foods do not seem to aggravate or alleviate the symptoms it seems to be anything she eats.  The patient denies bloody bowel movements or diarrhea.  She does continue to have some bloating which was improved initially after the procedure but it has returned.   No history of peptic ulcer disease.    She is also followed for leg swelling.  The swelling has persisted and the discomfort associated with swelling continues.  In particular she has been having increased pain in the right thigh that seems to radiate down to the calf.  It is located in the medial area of the leg.  There have not been any interval development of a ulcerations or wounds.   Since the previous visit the patient has been wearing graduated compression stockings and has noted little if any improvement in the lymphedema. The patient has been using compression routinely morning until night.   The patient also states elevation during the day and exercise is being done too.   The patient denies amaurosis fugax or recent TIA symptoms. There are no recent neurological changes noted. The patient denies claudication symptoms or rest pain symptoms. The patient denies history of DVT, PE or  superficial thrombophlebitis. The patient denies recent episodes of angina.   Previous duplex ultrasound of the venous system demonstrates a normal venous system no evidence of superficial reflux or deep venous reflux.   Duplex US  of the mesenteric arteries done today shows patent celiac artery stent.  There is now a significant stenosis of the SMA identified with velocities greater than 300 cm/s. A change compared to the previous study with worsening of the SMA.  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   right neck postauricular, treated with LN2 08/09/24 and topical chemo cream    Past Surgical History:  Procedure Laterality Date   BILATERAL SALPINGECTOMY Bilateral 07/07/2015   Procedure: BILATERAL SALPINGECTOMY;  Surgeon: Debby JINNY Dinsmore, MD;  Location: [facility] ORS;  Service: Gynecology;  Laterality: Bilateral;   CHOLECYSTECTOMY     TUBAL LIGATION     VAGINAL HYSTERECTOMY N/A 07/07/2015  Procedure: HYSTERECTOMY VAGINAL;  Surgeon: Debby JINNY Dinsmore, MD;  Location: [facility] ORS;  Service: Gynecology;  Laterality: N/A;   VISCERAL ANGIOGRAPHY N/A 02/08/2023   Procedure: VISCERAL ANGIOGRAPHY;  Surgeon: Jama Cordella MATSU, MD;  Location: [facility];  Service: Cardiovascular;  Laterality: N/A;    Social History Social History[2]  Family History Family History  Problem Relation Age of Onset   Heart attack Mother    Breast cancer Paternal Grandmother        great gm   Breast cancer Cousin 50       mat cousin   Kidney disease Neg Hx    Kidney cancer Neg Hx    Prostate cancer Neg Hx     Allergies[3]   REVIEW OF SYSTEMS (Negative unless  checked)  Constitutional: [] Weight loss  [] Fever  [] Chills Cardiac: [] Chest pain   [] Chest pressure   [] Palpitations   [] Shortness of breath when laying flat   [] Shortness of breath with exertion. Vascular:  [x] Pain in legs with walking   [] Pain in legs at rest  [] History of DVT   [] Phlebitis   [] Swelling in legs   [] Varicose veins   [] Non-healing ulcers Pulmonary:   [] Uses home oxygen   [] Productive cough   [] Hemoptysis   [] Wheeze  [] COPD   [] Asthma Neurologic:  [] Dizziness   [] Seizures   [] History of stroke   [] History of TIA  [] Aphasia   [] Vissual changes   [] Weakness or numbness in arm   [] Weakness or numbness in leg Musculoskeletal:   [] Joint swelling   [] Joint pain   [] Low back pain Hematologic:  [] Easy bruising  [] Easy bleeding   [] Hypercoagulable state   [] Anemic Gastrointestinal:  [] Diarrhea   [] Vomiting  [] Gastroesophageal reflux/heartburn   [] Difficulty swallowing. Genitourinary:  [] Chronic kidney disease   [] Difficult urination  [] Frequent urination   [] Blood in urine Skin:  [] Rashes   [] Ulcers  Psychological:  [] History of anxiety   []  History of major depression.  Physical Examination  Vitals:   09/18/24 0718  BP: 112/62  Pulse: 74  Resp: 12  Temp: 97.8 F (36.6 C)  TempSrc: Oral  SpO2: 97%  Weight: 79.4 kg  Height: 5' 5 (1.651 m)   Body mass index is 29.12 kg/m. Gen: WD/WN, NAD Head: [place]/AT, No temporalis wasting.  Ear/Nose/Throat: Hearing grossly intact, nares w/o erythema or drainage Eyes: PER, EOMI, sclera nonicteric.  Neck: Supple, no masses.  No bruit or JVD.  Pulmonary:  Good air movement, no audible wheezing, no use of accessory muscles.  Cardiac: RRR, normal S1, S2, no Murmurs. Vascular:  mild trophic changes, no open wounds Vessel Right Left  Radial Palpable Palpable  Gastrointestinal: soft, non-distended. No guarding/no peritoneal signs.  Musculoskeletal: M/S 5/5 throughout.  No visible deformity.  Neurologic: CN 2-12 intact. Pain and light touch  intact in extremities.  Symmetrical.  Speech is fluent. Motor exam as listed above. Psychiatric: Judgment intact, Mood & affect appropriate for pt's clinical situation. Dermatologic: No rashes or ulcers noted.  No changes consistent with cellulitis.   CBC Lab Results  Component Value Date   WBC 10.5 07/07/2015   HGB 12.8 07/07/2015   HCT 38.2 07/07/2015   MCV 94.8 07/07/2015   PLT 200 07/07/2015    BMET    Component Value Date/Time   NA 136 07/08/2015 0712   NA 142 09/03/2011 1543   K 3.6 07/08/2015 0712   K 3.3 (L) 09/03/2011 1543   CL 100 (L) 07/08/2015 0712   CL 106 09/03/2011 1543  CO2 29 07/08/2015 0712   CO2 25 09/03/2011 1543   GLUCOSE 86 07/08/2015 0712   GLUCOSE 93 09/03/2011 1543   BUN 14 02/08/2023 1122   BUN 11 09/03/2011 1543   CREATININE 1.06 (H) 02/08/2023 1122   CREATININE 0.76 09/03/2011 1543   CALCIUM  8.7 (L) 07/08/2015 0712   CALCIUM  8.8 09/03/2011 1543   GFRNONAA 59 (L) 02/08/2023 1122   GFRNONAA >60 09/03/2011 1543   GFRAA >60 07/08/2015 0712   GFRAA >60 09/03/2011 1543   CrCl cannot be calculated (Patient's most recent lab result is older than the maximum 21 days allowed.).  COAG No results found for: INR, PROTIME  Radiology No results found.   Assessment/Plan 1. Chronic mesenteric ischemia (Primary) Recommend:   The patient has evidence of severe atherosclerotic changes of the mesenteric arteries associated with weight loss as well as abdominal pain and N/V.  This represents a high risk for bowel infarction and death.  In particular it appears her SMA has progressed while her celiac appears to have been stable status post intervention   Patient should undergo angiography of the mesenteric arteries with the hope for intervention to eliminate the ischemic symptoms.     The risks and benefits as well as the alternative therapies was discussed in detail with the patient.  All questions were answered.  Patient agrees to proceed with  angiography and intervention.   The patient will follow up with me after the angiogram.   2. Celiac artery stenosis Recommend:   The patient has evidence of severe atherosclerotic changes of the mesenteric arteries associated with weight loss as well as abdominal pain and N/V.  This represents a high risk for bowel infarction and death.  In particular it appears her SMA has progressed while her celiac appears to have been stable status post intervention   Patient should undergo angiography of the mesenteric arteries with the hope for intervention to eliminate the ischemic symptoms.     The risks and benefits as well as the alternative therapies was discussed in detail with the patient.  All questions were answered.  Patient agrees to proceed with angiography and intervention.   The patient will follow up with me after the angiogram.   3. Pain and swelling of lower leg, right Recommend:   I have had a long discussion with the patient regarding swelling and why it  causes symptoms.  Patient will begin wearing graduated compression on a daily basis a prescription was given. The patient will  wear the stockings first thing in the morning and removing them in the evening. The patient is instructed specifically not to sleep in the stockings.    In addition, behavioral modification will be initiated.  This will include frequent elevation, use of over the counter pain medications and exercise such as walking.   Consideration for a lymph pump will also be made based upon the effectiveness of conservative therapy.  This would help to improve the edema control and prevent sequela such as ulcers and infections    Patient should undergo duplex ultrasound of the venous system to ensure that DVT, superficial phlebitis or reflux is not present.   The patient will follow-up with me after the ultrasound.  - VAS US  LOWER EXTREMITY VENOUS REFLUX; Future   4. Lymphedema Recommend:   I have had a long  discussion with the patient regarding swelling and why it  causes symptoms.  Patient will begin wearing graduated compression on a daily basis a prescription was given.  The patient will  wear the stockings first thing in the morning and removing them in the evening. The patient is instructed specifically not to sleep in the stockings.    In addition, behavioral modification will be initiated.  This will include frequent elevation, use of over the counter pain medications and exercise such as walking.   Consideration for a lymph pump will also be made based upon the effectiveness of conservative therapy.  This would help to improve the edema control and prevent sequela such as ulcers and infections    Patient should undergo duplex ultrasound of the venous system to ensure that DVT, superficial phlebitis or reflux is not present.   The patient will follow-up with me after the ultrasound.    5. Mixed hyperlipidemia Continue statin as ordered and reviewed, no changes at this time   6. Gastroesophageal reflux disease, unspecified whether esophagitis present Continue PPI as already ordered, this medication has been reviewed and there are no changes at this time.   Avoidence of caffeine and alcohol   Moderate elevation of the head of the bed    7. Chronic venous insufficiency Recommend:   I have had a long discussion with the patient regarding swelling and why it  causes symptoms.  Patient will begin wearing graduated compression on a daily basis a prescription was given. The patient will  wear the stockings first thing in the morning and removing them in the evening. The patient is instructed specifically not to sleep in the stockings.    In addition, behavioral modification will be initiated.  This will include frequent elevation, use of over the counter pain medications and exercise such as walking.   Consideration for a lymph pump will also be made based upon the effectiveness of conservative  therapy.  This would help to improve the edema control and prevent sequela such as ulcers and infections    Patient should undergo duplex ultrasound of the venous system to ensure that DVT, superficial phlebitis or reflux is not present.   The patient will follow-up with me after the ultrasound.    Cordella Shawl, MD  09/18/2024 8:24 AM      [1]  Current Meds  Medication Sig   atorvastatin  (LIPITOR) 10 MG tablet Take 1 tablet (10 mg total) by mouth daily.   buPROPion (WELLBUTRIN XL) 300 MG 24 hr tablet Take 300 mg by mouth daily.   CALCIUM  CARBONATE-VIT D-MIN PO Take 1 tablet by mouth 2 (two) times daily.   clopidogrel  (PLAVIX ) 75 MG tablet TAKE 1 TABLET(75 MG) BY MOUTH DAILY   Cyanocobalamin (VITAMIN B 12 PO) Take 1 tablet by mouth 2 (two) times daily.   folic acid (FOLVITE) 1 MG tablet Take 1 mg by mouth daily.   gabapentin (NEURONTIN) 400 MG capsule Take 400 mg by mouth 3 (three) times daily.   latanoprost (XALATAN) 0.005 % ophthalmic solution INSTILL 1 DROP IN BOTH EYES AT BEDTIME   levothyroxine (SYNTHROID) 25 MCG tablet Take on an empty stomach with a glass of water at least 30-60 minutes before breakfast. Need to recheck Thyroid level before this RX runs out   losartan (COZAAR) 25 MG tablet Take 25 mg by mouth daily.   magnesium oxide (MAG-OX) 400 MG tablet Take by mouth daily.   Melatonin 1 MG TABS Take 1 tablet by mouth at bedtime.   minoxidil  (LONITEN ) 2.5 MG tablet Take 1 tablet (2.5 mg total) by mouth daily.   naproxen (NAPROSYN) 500 MG tablet Take 500 mg by  mouth 2 (two) times daily with a meal.   pantoprazole (PROTONIX) 40 MG tablet Take 40 mg by mouth 2 (two) times daily.   Potassium 95 MG TABS Take by mouth 2 (two) times daily.   venlafaxine (EFFEXOR) 75 MG tablet Take 75 mg by mouth every morning.  [2]  Social History Tobacco Use   Smoking status: Former    Current packs/day: 0.00    Types: Cigarettes    Quit date: 06/30/2009    Years since quitting: 15.2    Smokeless tobacco: Never  Substance Use Topics   Alcohol use: Yes    Alcohol/week: 2.0 standard drinks of alcohol    Types: 2 Glasses of wine per week   Drug use: No  [3]  Allergies Allergen Reactions   Penicillins Anaphylaxis and Rash   Etodolac Other (See Comments)    Mouth sores   Macrobid [Nitrofurantoin Monohyd Macro] Itching   Meloxicam Itching   Premarin  [Conjugated Estrogens ] Itching   Adhesive [Tape] Rash    Paper tape ok to use.   Biaxin [Clarithromycin] Rash   "

## 2024-09-18 NOTE — Interval H&P Note (Signed)
 History and Physical Interval Note:  09/18/2024 8:29 AM  Amy Hammond  has presented today for surgery, with the diagnosis of SMA Angio w stent    Chronic mesenteric ischemia.  The various methods of treatment have been discussed with the patient and family. After consideration of risks, benefits and other options for treatment, the patient has consented to  Procedures: VISCERAL ANGIOGRAPHY (N/A) as a surgical intervention.  The patient's history has been reviewed, patient examined, no change in status, stable for surgery.  I have reviewed the patient's chart and labs.  Questions were answered to the patient's satisfaction.     Cordella Shawl

## 2024-09-18 NOTE — Progress Notes (Signed)
 Dr. Jama in at bedside , speaking with pt. And her daughter. Both verbalized understanding of conversation with MD.

## 2024-09-18 NOTE — Op Note (Signed)
 [facility]  Percutaneous Study/Intervention Procedural Note   Date: 09/18/2024  Surgeon(s): Cordella Shawl, MD  Assistants: none  Pre-operative Diagnosis: 1.  Acute on chronic mesenteric ischemia 2.  Superior mesenteric artery stenosis   Post-operative diagnosis:  Same  Procedure(s) Performed:             1.  Ultrasound guidance for vascular access right femoral artery femoral artery             2.  Catheter placement into superior mesenteric artery from right femoral approach             3.  Aortogram and selective angiogram of the superior mesenteric artery             4.  Stent to the superior mesenteric artery with 7 mm diameter x 26 mm length balloon expandable stent             5.  StarClose closure device right femoral artery  Contrast: 50 cc  Fluoro time: 6.6  EBL: Less than 10 cc  Anesthesia: Continuous ECG pulse oximetry and cardiopulmonary monitoring was performed throughout the entire procedure by the interventional radiology nurse.  Parenteral Versed  and fentanyl  were administered.  Total sedation time was 33 minutes.              Indications:  Patient is a 64 y.o. female who has symptoms consistent with mesenteric ischemia. The patient has a duplex ultrasound showing greater than 90% stenosis of the SMA.  Clinically she has significant weight loss and is now describing food fear as well as postprandial abdominal pain the patient is brought in for angiography for further evaluation and potential treatment. Risks and benefits are discussed and informed consent is obtained  Procedure:  The patient was identified and appropriate procedural time out was performed.  The patient was then placed supine on the table and prepped and draped in the usual sterile fashion. Moderate conscious sedation was administered during a face to face encounter with the patient throughout the procedure with my supervision of the RN administering medicines and monitoring  the patient's vital signs, pulse oximetry, telemetry and mental status throughout from the start of the procedure until the patient was taken to the recovery room. Ultrasound was used to evaluate the right common femoral artery.  It was patent .  A digital ultrasound image was acquired.  A Seldinger needle was used to access the right common femoral artery under direct ultrasound guidance and a permanent image was performed.  A 0.035 J wire was advanced without resistance and a 5Fr sheath was placed.  Pigtail catheter was placed into the aorta and an AP aortogram was performed. This demonstrated the origins to the celiac and the SMA with distal branches free of hemodynamically significant disease. We transitioned to the lateral projection to image the celiac and SMA. The lateral image demonstrated greater than 90% stenosis at the origin of the SMA there appears to be moderate in-stent restenosis of the previously placed celiac artery stent at its origin.  The patient was given 6000 units of IV heparin . We upsized to a 6 Fr Ansell sheath.  A V S1 catheter was used to selectively cannulate the SMA.  This demonstrated a greater than 90% stenosis of the SMA at the origin. Based on her symptoms and these findings, I elected to treat the SMA to try to improve the patient's clinical course. I crossed the lesion without difficulty with the VS1 catheter and a  stiff angled Glidewire.  Hand-injection contrast through the catheter demonstrated the distal SMA I then exchanged the Glidewire for a supra core wire.  I then used a 7 mm diameter x 26 mm length balloon expandable Lifestream stent to perform treatment of the origin of the SMA. I inflated the balloon to 12 atm.  Follow-up imaging demonstrated 30 to 40% residual stenosis and an 8 mm x 40 mm Lutonix drug-eluting balloon was advanced across the stent and inflated to 4 to 6 atm for approximately 1 minute.  On completion angiogram following this, less than 10% residual  stenosis was identified. At this point, I elected to terminate the procedure. The diagnostic catheter was removed. StarClose closure device was deployed in usual fashion with excellent hemostatic result. The patient was taken to the recovery room in stable condition having tolerated the procedure well.     Findings: Imaging demonstrates the distal SMA and celiac artery branches are widely patent.  The origin of the SMA demonstrates a greater than 90% stenosis.  The origin of the celiac demonstrates previously placed stent with moderate in-stent restenosis.  Following angioplasty and stent placement there is less than 10% residual stenosis at the origin of the SMA.  Disposition: Patient was taken to the recovery room in stable condition having tolerated the procedure well.  Complications:  None  Cordella Shawl 09/18/2024 9:26 AM   This note was created with Dragon Medical transcription system. Any errors in dictation are purely unintentional.

## 2024-10-19 ENCOUNTER — Ambulatory Visit (INDEPENDENT_AMBULATORY_CARE_PROVIDER_SITE_OTHER): Admitting: Nurse Practitioner

## 2024-10-19 ENCOUNTER — Encounter (INDEPENDENT_AMBULATORY_CARE_PROVIDER_SITE_OTHER)

## 2024-11-14 ENCOUNTER — Ambulatory Visit: Admitting: Dermatology

## 2025-01-10 ENCOUNTER — Ambulatory Visit: Admitting: Dermatology
